# Patient Record
Sex: Male | Born: 1955 | Race: White | Hispanic: No | Marital: Single | Smoking: Never smoker
Health system: Southern US, Community
[De-identification: ages and names within clinical notes are randomized; demographics above are authoritative.]

## PROBLEM LIST (undated history)

## (undated) DIAGNOSIS — K209 Esophagitis, unspecified without bleeding: Secondary | ICD-10-CM

## (undated) DIAGNOSIS — K222 Esophageal obstruction: Secondary | ICD-10-CM

## (undated) DIAGNOSIS — K449 Diaphragmatic hernia without obstruction or gangrene: Secondary | ICD-10-CM

## (undated) DIAGNOSIS — T8859XA Other complications of anesthesia, initial encounter: Secondary | ICD-10-CM

## (undated) DIAGNOSIS — T4145XA Adverse effect of unspecified anesthetic, initial encounter: Secondary | ICD-10-CM

## (undated) DIAGNOSIS — K219 Gastro-esophageal reflux disease without esophagitis: Secondary | ICD-10-CM

## (undated) DIAGNOSIS — R0602 Shortness of breath: Secondary | ICD-10-CM

## (undated) DIAGNOSIS — J45909 Unspecified asthma, uncomplicated: Secondary | ICD-10-CM

## (undated) DIAGNOSIS — G809 Cerebral palsy, unspecified: Secondary | ICD-10-CM

## (undated) DIAGNOSIS — K2270 Barrett's esophagus without dysplasia: Secondary | ICD-10-CM

## (undated) HISTORY — DX: Gastro-esophageal reflux disease without esophagitis: K21.9

## (undated) HISTORY — DX: Esophagitis, unspecified: K20.9

## (undated) HISTORY — DX: Cerebral palsy, unspecified: G80.9

## (undated) HISTORY — DX: Barrett's esophagus without dysplasia: K22.70

## (undated) HISTORY — DX: Diaphragmatic hernia without obstruction or gangrene: K44.9

## (undated) HISTORY — DX: Esophagitis, unspecified without bleeding: K20.90

## (undated) HISTORY — PX: OTHER SURGICAL HISTORY: SHX169

## (undated) HISTORY — DX: Esophageal obstruction: K22.2

---

## 1999-09-08 ENCOUNTER — Ambulatory Visit (HOSPITAL_COMMUNITY): Admission: RE | Admit: 1999-09-08 | Discharge: 1999-09-08 | Payer: Self-pay | Admitting: Internal Medicine

## 1999-09-08 ENCOUNTER — Encounter: Payer: Self-pay | Admitting: Internal Medicine

## 1999-09-30 ENCOUNTER — Encounter: Payer: Self-pay | Admitting: Internal Medicine

## 1999-09-30 ENCOUNTER — Ambulatory Visit (HOSPITAL_COMMUNITY): Admission: RE | Admit: 1999-09-30 | Discharge: 1999-09-30 | Payer: Self-pay | Admitting: Internal Medicine

## 1999-10-20 ENCOUNTER — Ambulatory Visit (HOSPITAL_COMMUNITY): Admission: RE | Admit: 1999-10-20 | Discharge: 1999-10-20 | Payer: Self-pay | Admitting: Internal Medicine

## 1999-10-20 ENCOUNTER — Encounter: Payer: Self-pay | Admitting: Internal Medicine

## 1999-11-10 ENCOUNTER — Ambulatory Visit (HOSPITAL_COMMUNITY): Admission: RE | Admit: 1999-11-10 | Discharge: 1999-11-10 | Payer: Self-pay | Admitting: Internal Medicine

## 1999-11-10 ENCOUNTER — Encounter: Payer: Self-pay | Admitting: Internal Medicine

## 2001-02-03 ENCOUNTER — Ambulatory Visit (HOSPITAL_COMMUNITY): Admission: RE | Admit: 2001-02-03 | Discharge: 2001-02-03 | Payer: Self-pay | Admitting: Internal Medicine

## 2002-08-30 ENCOUNTER — Encounter: Payer: Self-pay | Admitting: Internal Medicine

## 2002-08-30 ENCOUNTER — Ambulatory Visit (HOSPITAL_COMMUNITY): Admission: RE | Admit: 2002-08-30 | Discharge: 2002-08-30 | Payer: Self-pay | Admitting: Internal Medicine

## 2004-04-09 ENCOUNTER — Ambulatory Visit (HOSPITAL_COMMUNITY): Admission: RE | Admit: 2004-04-09 | Discharge: 2004-04-09 | Payer: Self-pay | Admitting: Internal Medicine

## 2007-02-09 ENCOUNTER — Ambulatory Visit: Payer: Self-pay | Admitting: Internal Medicine

## 2007-04-04 ENCOUNTER — Encounter: Admission: RE | Admit: 2007-04-04 | Discharge: 2007-04-04 | Payer: Self-pay | Admitting: Internal Medicine

## 2007-04-04 ENCOUNTER — Ambulatory Visit: Payer: Self-pay | Admitting: Internal Medicine

## 2007-04-11 ENCOUNTER — Ambulatory Visit: Payer: Self-pay | Admitting: Internal Medicine

## 2007-04-11 LAB — CONVERTED CEMR LAB
AST: 16 units/L (ref 0–37)
Cholesterol: 152 mg/dL (ref 0–200)
Creatinine, Ser: 1 mg/dL (ref 0.4–1.5)
Potassium: 3.8 meq/L (ref 3.5–5.1)

## 2007-04-18 ENCOUNTER — Ambulatory Visit: Payer: Self-pay | Admitting: Internal Medicine

## 2007-07-27 ENCOUNTER — Telehealth (INDEPENDENT_AMBULATORY_CARE_PROVIDER_SITE_OTHER): Payer: Self-pay | Admitting: *Deleted

## 2007-07-28 ENCOUNTER — Encounter (INDEPENDENT_AMBULATORY_CARE_PROVIDER_SITE_OTHER): Payer: Self-pay | Admitting: *Deleted

## 2008-01-16 ENCOUNTER — Encounter: Payer: Self-pay | Admitting: Internal Medicine

## 2008-01-17 ENCOUNTER — Encounter: Payer: Self-pay | Admitting: Internal Medicine

## 2008-06-03 DIAGNOSIS — G809 Cerebral palsy, unspecified: Secondary | ICD-10-CM | POA: Insufficient documentation

## 2008-06-03 DIAGNOSIS — K219 Gastro-esophageal reflux disease without esophagitis: Secondary | ICD-10-CM | POA: Insufficient documentation

## 2008-06-03 DIAGNOSIS — K2270 Barrett's esophagus without dysplasia: Secondary | ICD-10-CM | POA: Insufficient documentation

## 2008-06-05 ENCOUNTER — Ambulatory Visit: Payer: Self-pay | Admitting: Internal Medicine

## 2008-06-05 DIAGNOSIS — K222 Esophageal obstruction: Secondary | ICD-10-CM | POA: Insufficient documentation

## 2008-06-06 ENCOUNTER — Encounter: Payer: Self-pay | Admitting: Internal Medicine

## 2008-12-25 ENCOUNTER — Ambulatory Visit: Payer: Self-pay | Admitting: Internal Medicine

## 2008-12-25 ENCOUNTER — Encounter (INDEPENDENT_AMBULATORY_CARE_PROVIDER_SITE_OTHER): Payer: Self-pay | Admitting: *Deleted

## 2008-12-25 DIAGNOSIS — H8110 Benign paroxysmal vertigo, unspecified ear: Secondary | ICD-10-CM | POA: Insufficient documentation

## 2008-12-25 DIAGNOSIS — R0989 Other specified symptoms and signs involving the circulatory and respiratory systems: Secondary | ICD-10-CM | POA: Insufficient documentation

## 2008-12-25 DIAGNOSIS — J309 Allergic rhinitis, unspecified: Secondary | ICD-10-CM | POA: Insufficient documentation

## 2008-12-25 DIAGNOSIS — R0609 Other forms of dyspnea: Secondary | ICD-10-CM

## 2008-12-28 LAB — CONVERTED CEMR LAB
Basophils Absolute: 0 10*3/uL (ref 0.0–0.1)
Bilirubin, Direct: 0.1 mg/dL (ref 0.0–0.3)
Calcium: 9.3 mg/dL (ref 8.4–10.5)
Cholesterol: 155 mg/dL (ref 0–200)
GFR calc Af Amer: 129 mL/min
Glucose, Bld: 112 mg/dL — ABNORMAL HIGH (ref 70–99)
HCT: 45.5 % (ref 39.0–52.0)
Hemoglobin: 15.8 g/dL (ref 13.0–17.0)
MCHC: 34.7 g/dL (ref 30.0–36.0)
MCV: 88.9 fL (ref 78.0–100.0)
Monocytes Absolute: 0.5 10*3/uL (ref 0.1–1.0)
Monocytes Relative: 7.3 % (ref 3.0–12.0)
Neutro Abs: 4.4 10*3/uL (ref 1.4–7.7)
RDW: 12.7 % (ref 11.5–14.6)
Sodium: 144 meq/L (ref 135–145)
TSH: 1.14 microintl units/mL (ref 0.35–5.50)
Total Protein: 6.1 g/dL (ref 6.0–8.3)
Triglycerides: 46 mg/dL (ref 0–149)

## 2008-12-30 ENCOUNTER — Encounter: Admission: RE | Admit: 2008-12-30 | Discharge: 2008-12-30 | Payer: Self-pay | Admitting: Internal Medicine

## 2008-12-30 ENCOUNTER — Encounter (INDEPENDENT_AMBULATORY_CARE_PROVIDER_SITE_OTHER): Payer: Self-pay | Admitting: *Deleted

## 2009-01-02 ENCOUNTER — Telehealth (INDEPENDENT_AMBULATORY_CARE_PROVIDER_SITE_OTHER): Payer: Self-pay | Admitting: *Deleted

## 2009-01-18 ENCOUNTER — Telehealth: Payer: Self-pay | Admitting: Family Medicine

## 2009-01-23 ENCOUNTER — Encounter: Payer: Self-pay | Admitting: Internal Medicine

## 2009-05-21 ENCOUNTER — Ambulatory Visit: Payer: Self-pay | Admitting: Internal Medicine

## 2009-05-21 DIAGNOSIS — N390 Urinary tract infection, site not specified: Secondary | ICD-10-CM | POA: Insufficient documentation

## 2009-05-21 DIAGNOSIS — R9431 Abnormal electrocardiogram [ECG] [EKG]: Secondary | ICD-10-CM | POA: Insufficient documentation

## 2009-05-21 LAB — CONVERTED CEMR LAB
Blood in Urine, dipstick: NEGATIVE
Glucose, Urine, Semiquant: NEGATIVE
Specific Gravity, Urine: 1.015
pH: 7

## 2009-05-22 ENCOUNTER — Encounter: Payer: Self-pay | Admitting: Internal Medicine

## 2009-05-29 ENCOUNTER — Telehealth (INDEPENDENT_AMBULATORY_CARE_PROVIDER_SITE_OTHER): Payer: Self-pay | Admitting: *Deleted

## 2009-05-29 ENCOUNTER — Encounter (INDEPENDENT_AMBULATORY_CARE_PROVIDER_SITE_OTHER): Payer: Self-pay | Admitting: *Deleted

## 2009-06-04 ENCOUNTER — Encounter: Payer: Self-pay | Admitting: Internal Medicine

## 2009-06-26 ENCOUNTER — Encounter: Payer: Self-pay | Admitting: Internal Medicine

## 2009-07-04 ENCOUNTER — Encounter: Payer: Self-pay | Admitting: Internal Medicine

## 2009-07-27 IMAGING — CR DG CHEST 1V
1 series · 1 of 1 positions shown · non-contrast
Comparison: 04/04/2007

CLINICAL DATA: Short of breath

CHEST - 1 VIEW

[view not recorded]
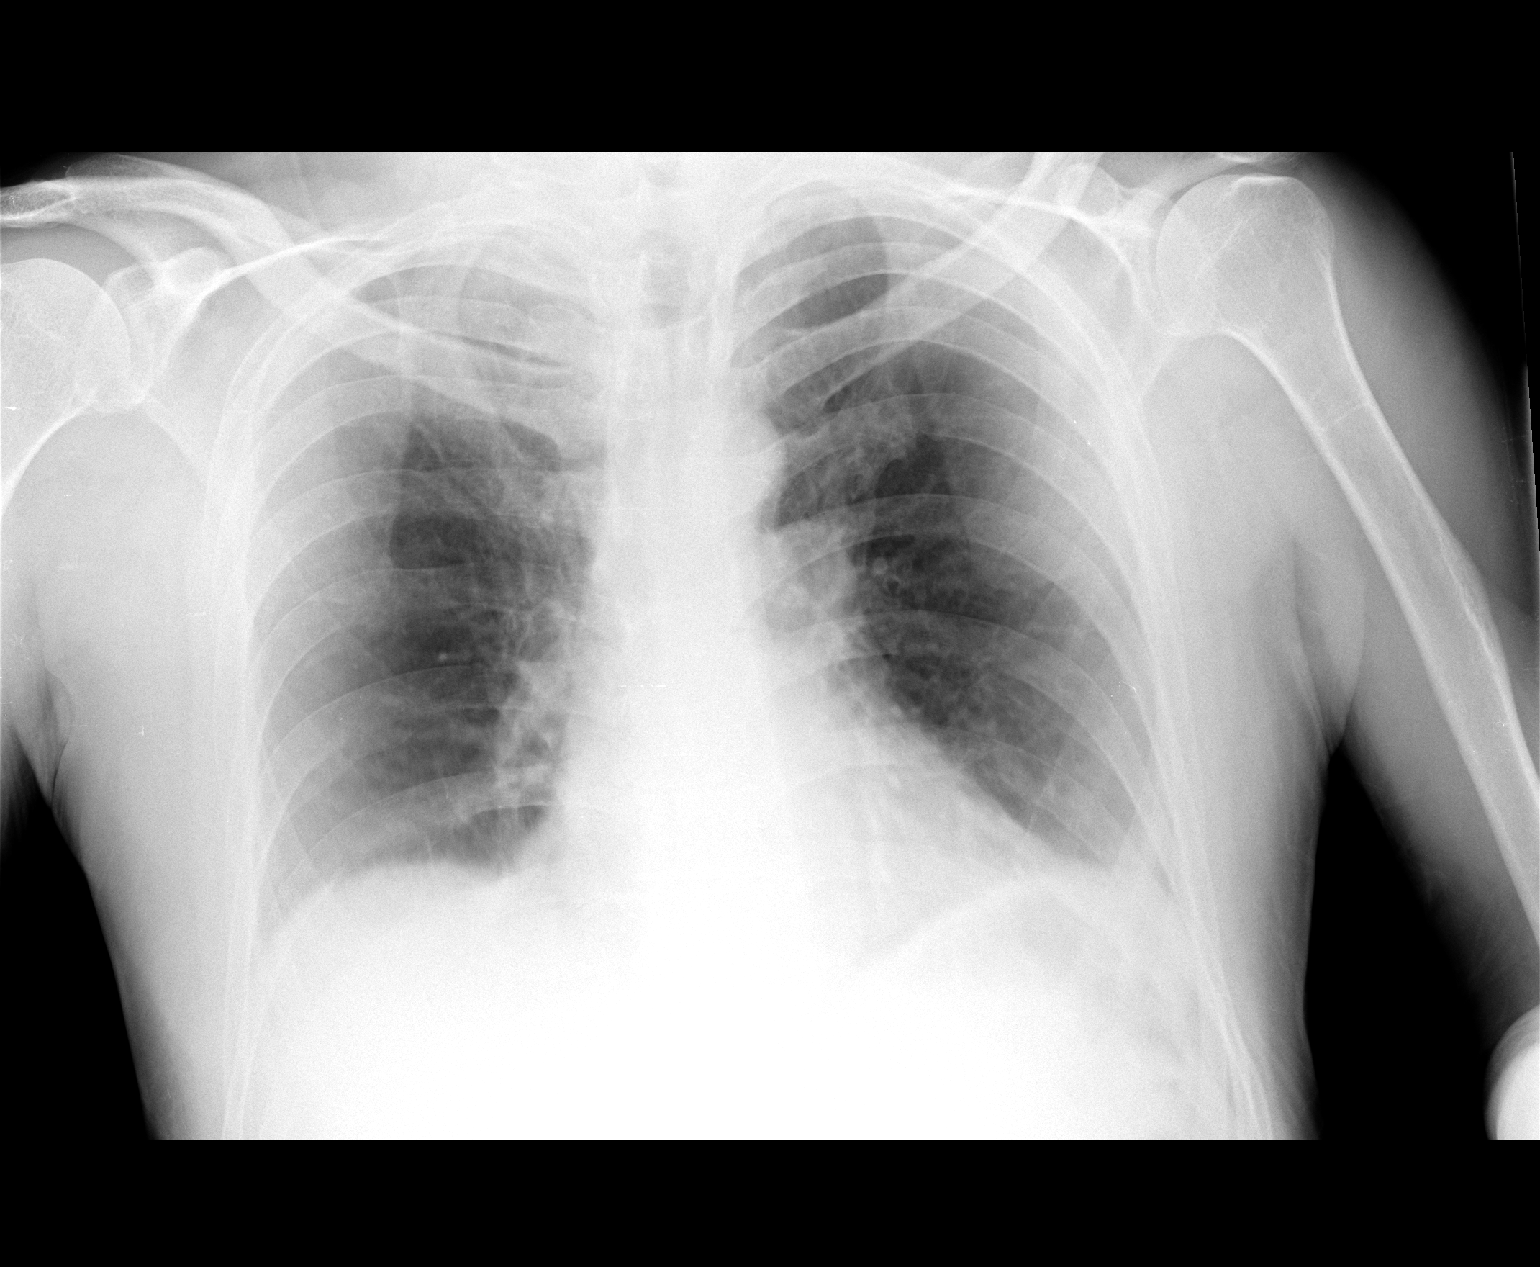

[1 of 1 positions shown; findings below may reference images not displayed]

FINDINGS: An AP upright image was obtained with the patient sitting
in a wheel chair.  He was unable to stand.

The level of inspiration is shallow.  There is some minimal
breathing motion artifact.  Given these limitations, there is no
obvious active cardiopulmonary process noted in one-view.
IMPRESSION: The study is limited technically for reasons mentioned above. No
definite active cardiopulmonary process in one-view.

## 2009-08-27 ENCOUNTER — Encounter: Payer: Self-pay | Admitting: Internal Medicine

## 2010-07-01 ENCOUNTER — Telehealth (INDEPENDENT_AMBULATORY_CARE_PROVIDER_SITE_OTHER): Payer: Self-pay | Admitting: *Deleted

## 2010-09-02 ENCOUNTER — Ambulatory Visit: Payer: Self-pay | Admitting: Internal Medicine

## 2011-01-03 ENCOUNTER — Encounter: Payer: Self-pay | Admitting: Internal Medicine

## 2011-01-14 NOTE — Progress Notes (Signed)
Summary: ear infection  Phone Note Call from Patient Call back at Home Phone [phone]   Caller: Mom Summary of Call: VM left on triage VM pt mom states that pt has a ear infection with some drainage.Pt mom would like something for this pls give mom a call back...............Marland KitchenFelecia Deloach CMA  July 01, 2010 10:32 AM   Follow-up for Phone Call        I spoke with patient's mom and informed her that he needs an appointment, she said she didnt really feel like bringing him in b/c he is in a wheelchair. I informed her its been over a year since patient was last seen and the doctor's are unable to treat patient's over the phone. I offered appointment with Dr.Paz or Dr.Tabori (both had openings at the time of call) (Dr.Hopper is only working 1/2 day). Patient's mom refused appointment's and said let her think about it and call back Follow-up by: Shonna Chock CMA,  July 01, 2010 10:44 AM

## 2011-01-14 NOTE — Assessment & Plan Note (Signed)
Summary: LEFT EAR DRAINING, NO PAIN, NO SINUS///SPH   Vital Signs:  Patient profile:   55 year old male Temp:     97.4 degrees F oral Pulse rate:   72 / minute Resp:     19 per minute BP sitting:   118 / 76  (left arm) Cuff size:   large  Vitals Entered By: Shonna Chock CMA (September 02, 2010 9:01 AM) CC: Left ear drainage X 1.5 Week(s), URI symptoms   Primary Care Provider:  Dr. Marga Melnick  CC:  Left ear drainage X 1.5 Week(s) and URI symptoms.  History of Present Illness:      This is a 55 year old man who presents with  yellow drainage from L ear > 1 week .  The patient reports nasal congestion, but denies purulent nasal discharge, sore throat, productive cough, and earache.  The patient denies fever, dyspnea, and wheezing.  The patient also reports headache.  The patient denies the following risk factors for Strep sinusitis: unilateral facial pain, tooth pain, and tender adenopathy.    Current Medications (verified): 1)  Nexium 40 Mg  Cpdr (Esomeprazole Magnesium) .... Take 1 Tablet By Mouth Once A Day 2)  Adult Aspirin Low Strength 81 Mg Tbdp (Aspirin) .Marland Kitchen.. 1 By Mouth Once Daily 3)  Vitamin C .... Take 1 Tablet By Mouth Once A Day 4)  Vitamin E .... Take 1 Tablet By Mouth Once A Day 5)  Ipratropium-Albuterol 0.5-2.5 (3) Mg/54ml Soln (Ipratropium-Albuterol) .Marland Kitchen.. 1 Ampule Q 4-6 Hrs As Needed Sob 6)  Metoprolol Succinate 25 Mg Xr24h-Tab (Metoprolol Succinate) .Marland Kitchen.. 1 By Mouth Once Daily  Allergies (verified): No Known Drug Allergies  Physical Exam  General:  Chronic neuromuscular stigmata,in no acute distress; cooperative throughout examination; dysarthria hinders communication Ears:  R ear normal, L canal drainage, L TM erythema, and L TM retraction.   Nose:  External nasal examination shows no deformity or inflammation. Nasal mucosa are pink and moist without lesions or exudates. Mouth:  Oral mucosa and oropharynx without lesions or exudates.  Teeth in good  repair. Lungs:  Normal respiratory effort, chest expands symmetrically. Lungs are clear to auscultation, no crackles or wheezes.BS decreased Cervical Nodes:  No lymphadenopathy noted Axillary Nodes:  No palpable lymphadenopathy   Impression & Recommendations:  Problem # 1:  OTITIS MEDIA, PURULENT, ACUTE (ICD-382.00)  His updated medication list for this problem includes:    Adult Aspirin Low Strength 81 Mg Tbdp (Aspirin) .Marland Kitchen... 1 by mouth once daily    Amoxicillin 500 Mg Caps (Amoxicillin) .Marland Kitchen... 1 three times a day  Complete Medication List: 1)  Nexium 40 Mg Cpdr (Esomeprazole magnesium) .... Take 1 tablet by mouth once a day 2)  Adult Aspirin Low Strength 81 Mg Tbdp (Aspirin) .Marland Kitchen.. 1 by mouth once daily 3)  Vitamin C  .... Take 1 tablet by mouth once a day 4)  Vitamin E  .... Take 1 tablet by mouth once a day 5)  Ipratropium-albuterol 0.5-2.5 (3) Mg/28ml Soln (Ipratropium-albuterol) .Marland Kitchen.. 1 ampule q 4-6 hrs as needed sob 6)  Metoprolol Succinate 25 Mg Xr24h-tab (Metoprolol succinate) .Marland Kitchen.. 1 by mouth once daily 7)  Amoxicillin 500 Mg Caps (Amoxicillin) .Marland Kitchen.. 1 three times a day 8)  Coly-mycin S 3.02-12-09-0.5 Mg/ml Susp (Neomycin-colist-hc-thonzonium) .... 3 drops three times a day  Patient Instructions: 1)  Drink as much NON dairy  fluid as you can tolerate for the next few days. Prescriptions: COLY-MYCIN S 3.02-12-09-0.5 MG/ML SUSP (NEOMYCIN-COLIST-HC-THONZONIUM) 3 drops three times  a day  #10cc x 0   Entered and Authorized by:   Marga Melnick MD   Signed by:   Marga Melnick MD on 09/02/2010   Method used:   Faxed to ...       [id number] (retail)       [address]       [place], [place]  [address]       Ph: [phone] or [phone]       Fax: [phone]   RxID:   [id number] AMOXICILLIN 500 MG CAPS (AMOXICILLIN) 1 three times a day  #30 x 0   Entered and Authorized by:   Marga Melnick MD   Signed by:   Marga Melnick MD on 09/02/2010   Method used:   Faxed to ...        [id number] (retail)       [address]       [place], [place]  [address]       Ph: [phone] or [phone]       Fax: [phone]   RxID:   [id number]

## 2011-06-26 ENCOUNTER — Other Ambulatory Visit: Payer: Self-pay | Admitting: Internal Medicine

## 2011-06-28 MED ORDER — ESOMEPRAZOLE MAGNESIUM 40 MG PO CPDR: 40.0000 mg | DELAYED_RELEASE_CAPSULE | Freq: Every day | ORAL | Status: DC

## 2011-06-28 NOTE — Telephone Encounter (Signed)
#  [facility]

## 2012-09-17 ENCOUNTER — Other Ambulatory Visit: Payer: Self-pay | Admitting: Internal Medicine

## 2012-10-27 ENCOUNTER — Other Ambulatory Visit: Payer: Self-pay | Admitting: Internal Medicine

## 2012-10-27 ENCOUNTER — Encounter: Payer: Self-pay | Admitting: Internal Medicine

## 2012-10-27 MED ORDER — ESOMEPRAZOLE MAGNESIUM 40 MG PO CPDR: 40.0000 mg | DELAYED_RELEASE_CAPSULE | Freq: Every day | ORAL | Status: DC

## 2012-10-27 NOTE — Telephone Encounter (Signed)
Refilled Nexium 

## 2012-11-29 ENCOUNTER — Ambulatory Visit: Payer: Self-pay | Admitting: Internal Medicine

## 2012-12-01 ENCOUNTER — Encounter: Payer: Self-pay | Admitting: *Deleted

## 2012-12-04 ENCOUNTER — Ambulatory Visit (INDEPENDENT_AMBULATORY_CARE_PROVIDER_SITE_OTHER): Payer: Medicare Other | Admitting: Internal Medicine

## 2012-12-04 ENCOUNTER — Encounter: Payer: Self-pay | Admitting: Internal Medicine

## 2012-12-04 VITALS — BP 154/100 | HR 88

## 2012-12-04 DIAGNOSIS — K2270 Barrett's esophagus without dysplasia: Secondary | ICD-10-CM

## 2012-12-04 DIAGNOSIS — R1310 Dysphagia, unspecified: Secondary | ICD-10-CM

## 2012-12-04 DIAGNOSIS — K222 Esophageal obstruction: Secondary | ICD-10-CM

## 2012-12-04 DIAGNOSIS — K219 Gastro-esophageal reflux disease without esophagitis: Secondary | ICD-10-CM

## 2012-12-04 MED ORDER — ESOMEPRAZOLE MAGNESIUM 40 MG PO CPDR: 40.0000 mg | DELAYED_RELEASE_CAPSULE | Freq: Every day | ORAL | Status: DC

## 2012-12-04 NOTE — Progress Notes (Signed)
HISTORY OF PRESENT ILLNESS:  Juan Dennis is a 56 y.o. male with cerebral palsy who presents today regarding reflux disease. He is accompanied by his mother. He was last seen in June of 2009. He is known to have reflux disease complicated by Barrett esophagus and peptic stricture requiring esophageal dilation. He has been on Nexium 40 mg daily with good control of symptoms. Significant symptoms off medication. He ran out of medication recently with worsening of symptoms. Now on over-the-counter Prevacid. It appears as last upper endoscopy was performed in June of 2009. He was post followup in 1 year. He has not been seen since. His mother reports coughing spells with eating and drinking. She feels that he eats to fast and does not chew his food well. It is not clear whether he is having esophageal dysphagia. Patient cannot contribute significantly to the history. GI review of systems is otherwise reported as negative.  REVIEW OF SYSTEMS:  All non-GI ROS negative except for cough and occasional exertional shortness of breath. Decreased hearing in the left ear  Past Medical History  Diagnosis Date  . Barrett's esophagus   . Esophageal stricture   . GERD (gastroesophageal reflux disease)   . Cerebral palsy   . Esophagitis   . Hiatal hernia     Past Surgical History  Procedure Date  . Adductor myotomy     both legs    Social History Latrel H Moody  reports that he has never smoked. He has never used smokeless tobacco. He reports that he does not drink alcohol or use illicit drugs.  family history includes Colon cancer in his maternal grandmother; Diabetes in his father; Heart disease in his father and mother; and Uterine cancer in his maternal grandmother.  No Known Allergies     PHYSICAL EXAMINATION: Vital signs: BP 154/100  Pulse 88 General: Well-developed, well-nourished, no acute distress. Wheelchair-bound. HEENT: Sclerae are anicteric, conjunctiva pink. Oral mucosa intact Lungs:  Clear Heart: Regular Abdomen: soft, nontender, nondistended, no obvious ascites, no peritoneal signs, normal bowel sounds. No organomegaly. Extremities: contractures,No edema Psychiatric: alert and oriented x3. Cooperative    ASSESSMENT:  #1. GERD, Barrett's esophagus and peptic stricture. Overdue for surveillance #2. Coughing or choking spells as described   PLAN:  #1. Reflux precautions #2. Refill Nexium 40 mg daily. Samples given #3. Schedule upper endoscopy with surveillance biopsies and possible esophageal dilation. At the hospital. The patient is high-risk. CRNA monitor propofol administration.The nature of the procedure, as well as the risks, benefits, and alternatives were carefully and thoroughly reviewed with the patient. Ample time for discussion and questions allowed. The patient understood, was satisfied, and agreed to proceed.  #4. May benefit from modified barium swallow with speech pathology as the patient may be experiencing some degree of penetration or aspiration with meals. Discussed with mother. She somewhat hesitant.

## 2012-12-04 NOTE — Patient Instructions (Addendum)
You have been scheduled for an endoscopy with propofol. Please follow written instructions given to you at your visit today. If you use inhalers (even only as needed) or a CPAP machine, please bring them with you on the day of your procedure.  You have been given samples of Nexium to continue to take one tablet by mouth once daily. A prescription has been sent to your pharmacy.

## 2013-01-05 ENCOUNTER — Encounter (HOSPITAL_COMMUNITY): Payer: Self-pay | Admitting: Pharmacy Technician

## 2013-01-11 ENCOUNTER — Encounter (HOSPITAL_COMMUNITY): Payer: Self-pay | Admitting: *Deleted

## 2013-01-11 NOTE — Pre-Procedure Instructions (Signed)
Your procedure is scheduled JY:[id number] 11, 2014 Report to [employer] [phone] Call this number if you have problems morning of your procedure:[phone]  Follow all bowel prep instructions per your doctor's orders.  Do not eat or drink anything after midnight the night before your procedure. You may brush your teeth, rinse out your mouth, but no water, no food, no chewing gum, no mints, no candies, no chewing tobacco.     Take these medicines the morning of your procedure with A SIP OF WATER:Metoprolol and Nexium   Please make arrangements for a responsible person to drive you home after the procedure. You cannot go home by cab/taxi. We recommend you have someone with you at home the first 24 hours after your procedure. Driver for procedure is mother [place] [phone]  LEAVE ALL VALUABLES, JEWELRY, BILLFOLD AT HOME.  NO DENTURES, CONTACT LENSES ALLOWED IN THE ENDOSCOPY ROOM.   YOU MAY WEAR DEODORANT, PLEASE REMOVE ALL JEWELRY, WATCHES RINGS, BODY PIERCINGS AND LEAVE AT HOME.   WOMEN: NO MAKE-UP, LOTIONS PERFUMES

## 2013-01-12 ENCOUNTER — Encounter (HOSPITAL_COMMUNITY): Payer: Self-pay | Admitting: *Deleted

## 2013-01-17 ENCOUNTER — Telehealth: Payer: Self-pay | Admitting: *Deleted

## 2013-01-17 ENCOUNTER — Telehealth: Payer: Self-pay | Admitting: Internal Medicine

## 2013-01-17 NOTE — Telephone Encounter (Signed)
VM left stating that Pt is schedule to have a endoscopy on 01-23-13. Pt just wanted Hopp to be aware.

## 2013-01-17 NOTE — Telephone Encounter (Signed)
Pts mother is concerned about the pt having the egd with dil. In the past pt has received fentanyl and versed and done fine. She is concerned because pt has not been put to sleep since he was 57 years old. She is worried because if he gets nauseated he cannot turn over on his own and he can't tell them. Pt only signs. I let her know I would convey her concerns to Dr. Marina Goodell. Dr. Marina Goodell notified.

## 2013-01-18 NOTE — Telephone Encounter (Signed)
Spoke with mother and she is aware

## 2013-01-18 NOTE — Telephone Encounter (Signed)
Let her know that propofol is a more modern and safer form of sedation. This was not practically available to him previously. The sedation is given by anesthesia expert to monitor him very closely. He has not put to sleep to a level where he needs a breathing tube. Also, recovery time is much quicker.

## 2013-01-23 ENCOUNTER — Other Ambulatory Visit: Payer: Self-pay | Admitting: Internal Medicine

## 2013-01-23 ENCOUNTER — Ambulatory Visit (HOSPITAL_COMMUNITY): Payer: Medicare Other | Admitting: Anesthesiology

## 2013-01-23 ENCOUNTER — Encounter (HOSPITAL_COMMUNITY): Payer: Self-pay | Admitting: *Deleted

## 2013-01-23 ENCOUNTER — Ambulatory Visit (HOSPITAL_COMMUNITY): Payer: Medicare Other

## 2013-01-23 ENCOUNTER — Encounter (HOSPITAL_COMMUNITY)
Admission: RE | Disposition: A | Discharge status: Home / Self Care | Payer: Self-pay | Source: Ambulatory Visit | Attending: Internal Medicine

## 2013-01-23 ENCOUNTER — Ambulatory Visit (HOSPITAL_COMMUNITY)
Admission: RE | Admit: 2013-01-23 | Discharge: 2013-01-23 | Disposition: A | Discharge status: Home / Self Care | Payer: Medicare Other | Source: Ambulatory Visit | Attending: Internal Medicine | Admitting: Internal Medicine

## 2013-01-23 ENCOUNTER — Encounter (HOSPITAL_COMMUNITY): Payer: Self-pay | Admitting: Anesthesiology

## 2013-01-23 DIAGNOSIS — G809 Cerebral palsy, unspecified: Secondary | ICD-10-CM | POA: Insufficient documentation

## 2013-01-23 DIAGNOSIS — K319 Disease of stomach and duodenum, unspecified: Secondary | ICD-10-CM | POA: Insufficient documentation

## 2013-01-23 DIAGNOSIS — Z79899 Other long term (current) drug therapy: Secondary | ICD-10-CM | POA: Insufficient documentation

## 2013-01-23 DIAGNOSIS — K222 Esophageal obstruction: Secondary | ICD-10-CM

## 2013-01-23 DIAGNOSIS — R1319 Other dysphagia: Secondary | ICD-10-CM

## 2013-01-23 DIAGNOSIS — K219 Gastro-esophageal reflux disease without esophagitis: Secondary | ICD-10-CM | POA: Insufficient documentation

## 2013-01-23 DIAGNOSIS — R05 Cough: Secondary | ICD-10-CM | POA: Insufficient documentation

## 2013-01-23 DIAGNOSIS — K2270 Barrett's esophagus without dysplasia: Secondary | ICD-10-CM | POA: Insufficient documentation

## 2013-01-23 DIAGNOSIS — R1310 Dysphagia, unspecified: Secondary | ICD-10-CM | POA: Insufficient documentation

## 2013-01-23 DIAGNOSIS — R059 Cough, unspecified: Secondary | ICD-10-CM | POA: Insufficient documentation

## 2013-01-23 HISTORY — PX: SAVORY DILATION: SHX5439

## 2013-01-23 HISTORY — DX: Shortness of breath: R06.02

## 2013-01-23 HISTORY — DX: Other complications of anesthesia, initial encounter: T88.59XA

## 2013-01-23 HISTORY — PX: ESOPHAGOGASTRODUODENOSCOPY: SHX5428

## 2013-01-23 HISTORY — DX: Adverse effect of unspecified anesthetic, initial encounter: T41.45XA

## 2013-01-23 HISTORY — DX: Unspecified asthma, uncomplicated: J45.909

## 2013-01-23 SURGERY — EGD (ESOPHAGOGASTRODUODENOSCOPY)
Anesthesia: Monitor Anesthesia Care

## 2013-01-23 MED ORDER — LACTATED RINGERS IV SOLN: INTRAVENOUS | Status: DC

## 2013-01-23 MED ORDER — FENTANYL CITRATE 0.05 MG/ML IJ SOLN
INTRAMUSCULAR | Status: DC | PRN
  Administered 2013-01-23 (×2): 50 ug via INTRAVENOUS

## 2013-01-23 MED ORDER — SODIUM CHLORIDE 0.9 % IV SOLN: INTRAVENOUS | Status: DC

## 2013-01-23 MED ORDER — PROPOFOL 10 MG/ML IV EMUL
INTRAVENOUS | Status: DC | PRN
  Administered 2013-01-23: 120 ug/kg/min via INTRAVENOUS

## 2013-01-23 MED ORDER — LACTATED RINGERS IV SOLN
INTRAVENOUS | Status: DC | PRN
  Administered 2013-01-23: 11:00:00 via INTRAVENOUS

## 2013-01-23 MED ORDER — KETAMINE HCL 10 MG/ML IJ SOLN
INTRAMUSCULAR | Status: DC | PRN
  Administered 2013-01-23 (×2): 10 mg via INTRAVENOUS

## 2013-01-23 MED ORDER — MIDAZOLAM HCL 5 MG/5ML IJ SOLN
INTRAMUSCULAR | Status: DC | PRN
  Administered 2013-01-23: 1 mg via INTRAVENOUS

## 2013-01-23 NOTE — Transfer of Care (Signed)
Immediate Anesthesia Transfer of Care Note  Patient: Juan Dennis  Procedure(s) Performed: Procedure(s): ESOPHAGOGASTRODUODENOSCOPY (EGD) (N/A) SAVORY DILATION (N/A)  Patient Location: PACU and Endoscopy Unit  Anesthesia Type:MAC  Level of Consciousness: awake and sedated  Airway & Oxygen Therapy: Patient Spontanous Breathing and Patient connected to nasal cannula oxygen  Post-op Assessment: Report given to PACU RN and Post -op Vital signs reviewed and stable  Post vital signs: Reviewed and stable  Complications: No apparent anesthesia complications

## 2013-01-23 NOTE — Op Note (Signed)
[facility] [address] [place], [address]   ENDOSCOPY PROCEDURE REPORT  PATIENT: Juan Dennis, Juan Dennis  MR#: [id number] BIRTHDATE: [birth year] , 57  yrs. old GENDER: Male ENDOSCOPIST: Roxy Cedar, MD REFERRED BY:  .  Self / Office PROCEDURE DATE:  01/23/2013 PROCEDURE:  EGD w/ biopsies     and EGD Savary dilation of esophagus (18mm) ASA CLASS:     Class II INDICATIONS:  history of Barrett's esophagus.   Dysphagia. MEDICATIONS: MAC sedation, administered by CRNA and See Anesthesia Report. TOPICAL ANESTHETIC: none  DESCRIPTION OF PROCEDURE: After the risks benefits and alternatives of the procedure were thoroughly explained, informed consent was obtained.  The Pentax Gastroscope [id number] endoscope was introduced through the mouth and advanced to the second portion of the duodenum. Without limitations.  The instrument was slowly withdrawn as the mucosa was fully examined.      The esophagus revealed Barrett's type columnar Mucosa in the distal 3-4 cm.  four-quadrant biopsies were taken every 2 cm.No active inflammation or nodularity.  No obvious stricture.the stomach revealed a small hiatal hernia and a few small benign gastric polyps.  No other abnormalities.  The duodenum was normal.  THERAPY: A Savary guidewire was placed into the gastric antrum under fluoroscopic control.  The endoscope was removed with the guidewire maintained in position.  Subsequently, an 18 mm Savary dilator was moderate over the guidewire.  This was followed on fluoroscopy.  No resistance or heme.  Tolerated well.         The scope was then withdrawn from the patient and the procedure completed.  COMPLICATIONS: There were no complications. ENDOSCOPIC IMPRESSION: 1.  Barrett's esophagus status post biopsies 2. History of esophageal stricture. Questionable dysphagia. Status post dilation 3. GERD  RECOMMENDATIONS: 1.  Clear liquids until , then soft foods rest iof day.  Resume prior  diet tomorrow. 2.  REPEAT SURVEILLANCE EGD IN 3 YEARS if no dysplasia on biopsy 3. Continue current medications  REPEAT EXAM:    eSigned:  Roxy Cedar, MD 01/23/2013 12:13 PM CC:The Patient

## 2013-01-23 NOTE — Anesthesia Postprocedure Evaluation (Signed)
Anesthesia Post Note  Patient: Juan Dennis  Procedure(s) Performed: Procedure(s) (LRB): ESOPHAGOGASTRODUODENOSCOPY (EGD) (N/A) SAVORY DILATION (N/A)  Anesthesia type: MAC  Patient location: PACU  Post pain: Pain level controlled  Post assessment: Post-op Vital signs reviewed  Last Vitals: BP 123/73  Temp(Src) 36.7 C (Oral)  Resp 12  Ht 5\' 7"  (1.702 m)  Wt 130 lb (58.968 kg)  BMI 20.36 kg/m2  SpO2 97%  Post vital signs: Reviewed  Level of consciousness: awake  Complications: No apparent anesthesia complications

## 2013-01-23 NOTE — Anesthesia Preprocedure Evaluation (Addendum)
Anesthesia Evaluation  Patient identified by MRN, date of birth, ID band Patient awake    Reviewed: Allergy & Precautions, H&P , NPO status , Patient's Chart, lab work & pertinent test results  History of Anesthesia Complications Negative for: history of anesthetic complications  Airway Mallampati: I TM Distance: >3 FB Neck ROM: Full    Dental  (+) Teeth Intact and Dental Advisory Given   Pulmonary shortness of breath and with exertion, asthma ,  breath sounds clear to auscultation  Pulmonary exam normal       Cardiovascular negative cardio ROS  Rhythm:Regular Rate:Normal     Neuro/Psych Cerebral palsy negative psych ROS   GI/Hepatic Neg liver ROS, hiatal hernia, GERD-  Medicated,  Endo/Other  negative endocrine ROS  Renal/GU negative Renal ROS     Musculoskeletal negative musculoskeletal ROS (+)   Abdominal   Peds  Hematology negative hematology ROS (+)   Anesthesia Other Findings   Reproductive/Obstetrics                          Anesthesia Physical Anesthesia Plan  ASA: III  Anesthesia Plan: MAC   Post-op Pain Management:    Induction: Intravenous  Airway Management Planned: Simple Face Mask  Additional Equipment:   Intra-op Plan:   Post-operative Plan:   Informed Consent: I have reviewed the patients History and Physical, chart, labs and discussed the procedure including the risks, benefits and alternatives for the proposed anesthesia with the patient or authorized representative who has indicated his/her understanding and acceptance.   Consent reviewed with POA  Plan Discussed with: CRNA  Anesthesia Plan Comments:         Anesthesia Quick Evaluation

## 2013-01-23 NOTE — H&P (Signed)
HISTORY OF PRESENT ILLNESS:  Juan Dennis is a 57 y.o. male with cerebral palsy who presents today regarding reflux disease. He is accompanied by his mother. He was last seen in June of 2009. He is known to have reflux disease complicated by Barrett esophagus and peptic stricture requiring esophageal dilation. He has been on Nexium 40 mg daily with good control of symptoms. Significant symptoms off medication. He ran out of medication recently with worsening of symptoms. Now on over-the-counter Prevacid. It appears as last upper endoscopy was performed in June of 2009. He was post followup in 1 year. He has not been seen since. His mother reports coughing spells with eating and drinking. She feels that he eats to fast and does not chew his food well. It is not clear whether he is having esophageal dysphagia. Patient cannot contribute significantly to the history. GI review of systems is otherwise reported as negative.  REVIEW OF SYSTEMS:  All non-GI ROS negative except for cough and occasional exertional shortness of breath. Decreased hearing in the left ear  Past Medical History   Diagnosis  Date   .  Barrett's esophagus    .  Esophageal stricture    .  GERD (gastroesophageal reflux disease)    .  Cerebral palsy    .  Esophagitis    .  Hiatal hernia     Past Surgical History   Procedure  Date   .  Adductor myotomy      both legs   Social History  Juan Dennis reports that he has never smoked. He has never used smokeless tobacco. He reports that he does not drink alcohol or use illicit drugs.  family history includes Colon cancer in his maternal grandmother; Diabetes in his father; Heart disease in his father and mother; and Uterine cancer in his maternal grandmother.  No Known Allergies  PHYSICAL EXAMINATION:  Vital signs: BP 154/100  Pulse 88  General: Well-developed, well-nourished, no acute distress. Wheelchair-bound.  HEENT: Sclerae are anicteric, conjunctiva pink. Oral mucosa intact   Lungs: Clear  Heart: Regular  Abdomen: soft, nontender, nondistended, no obvious ascites, no peritoneal signs, normal bowel sounds. No organomegaly.  Extremities: contractures,No edema  Psychiatric: alert and oriented x3. Cooperative  ASSESSMENT:  #1. GERD, Barrett's esophagus and peptic stricture. Overdue for surveillance  #2. Coughing or choking spells as described  PLAN:  #1. Reflux precautions  #2. Refill Nexium 40 mg daily. Samples given  #3. Schedule upper endoscopy with surveillance biopsies and possible esophageal dilation. At the hospital. The patient is high-risk. CRNA monitor propofol administration.The nature of the procedure, as well as the risks, benefits, and alternatives were carefully and thoroughly reviewed with the patient. Ample time for discussion and questions allowed. The patient understood, was satisfied, and agreed to proceed.  #4. May benefit from modified barium swallow with speech pathology as the patient may be experiencing some degree of penetration or aspiration with meals. Discussed with mother. She somewhat hesitant.   GI ATTENDING  Patient was seen in late December regarding GERD, Barrett's surveillance, and known peptic stricture with possible dysphagia. No interval change in the GI or general medical history. He is now for upper endoscopy the biopsies and possibly esophageal dilation. Physical exam is also unchanged.The nature of the procedure, as well as the risks, benefits, and alternatives were carefully and thoroughly reviewed with the patient. Ample time for discussion and questions allowed. The patient understood, was satisfied, and agreed to proceed.  Wilhemina Bonito. Marina Goodell,  Montez Hageman., M.D. [facility] Division of Gastroenterology

## 2013-01-24 ENCOUNTER — Encounter (HOSPITAL_COMMUNITY): Payer: Self-pay | Admitting: Internal Medicine

## 2013-01-24 ENCOUNTER — Encounter: Payer: Self-pay | Admitting: Internal Medicine

## 2014-01-09 ENCOUNTER — Telehealth: Payer: Self-pay

## 2014-01-09 MED ORDER — ESOMEPRAZOLE MAGNESIUM 40 MG PO CPDR: 40.0000 mg | DELAYED_RELEASE_CAPSULE | Freq: Every day | ORAL | Status: DC

## 2014-01-09 NOTE — Telephone Encounter (Signed)
Sent refilled rx for Nexium to new pharmacy per patient's request

## 2014-08-15 ENCOUNTER — Other Ambulatory Visit: Payer: Self-pay | Admitting: Internal Medicine

## 2014-11-16 ENCOUNTER — Other Ambulatory Visit: Payer: Self-pay | Admitting: Internal Medicine

## 2014-12-16 ENCOUNTER — Other Ambulatory Visit: Payer: Self-pay | Admitting: Internal Medicine

## 2015-01-17 ENCOUNTER — Telehealth: Payer: Self-pay

## 2015-01-17 NOTE — Telephone Encounter (Signed)
Spoke with [facility] at [employer] [employer] [employer] who is faxing the prior authorization form on patien'ts Nexium.  I will complete and fax back as soon as I get it.

## 2015-01-23 NOTE — Telephone Encounter (Signed)
Faxed prior authorization on Nexium.  Awaiting reponse.

## 2015-01-23 NOTE — Telephone Encounter (Signed)
Have called 2 separate times to get form faxed to me.  Verified fax number with rep.  They are going to try sending it to the fax machine in [place]

## 2015-01-28 NOTE — Telephone Encounter (Signed)
Spoke with Patient's mother who said patient's Nexium had been denied.  The insurance company is faxing me an appeal for fill out.  I told her I would fill it out and fax it back as soon as possible and then let her know.  Patient's mother acknowledged and understood.

## 2015-01-30 ENCOUNTER — Telehealth: Payer: Self-pay | Admitting: Internal Medicine

## 2015-01-30 NOTE — Telephone Encounter (Signed)
Pts mother calling wanting to know if we have heard anything from insurance co regarding Nexium.

## 2015-02-04 ENCOUNTER — Telehealth: Payer: Self-pay

## 2015-02-04 ENCOUNTER — Other Ambulatory Visit: Payer: Self-pay | Admitting: Internal Medicine

## 2015-02-04 MED ORDER — PANTOPRAZOLE SODIUM 40 MG PO TBEC: 40.0000 mg | DELAYED_RELEASE_TABLET | Freq: Every day | ORAL | Status: DC

## 2015-02-04 NOTE — Telephone Encounter (Signed)
Sent Pantoprazole to pharmacy [facility] [employer] - patient needs to try and fail this and one other before Nexium denial can be appealed.

## 2015-02-04 NOTE — Telephone Encounter (Signed)
Pantoprazole sent to pharmacy for patient to try, which is on his formulary.  If this doesn't work, we will be in a better position to appeal denial of Nexium. Called patient's mother a left a message to this effect.

## 2015-02-04 NOTE — Telephone Encounter (Signed)
Left message with patient's mother regarding the Nexium denial and the necessity to try Pantoprazole instead.  Sent Pantoprazole to pharmacy.  If this doesn't work we will try to appeal the Nexium again.

## 2015-02-05 ENCOUNTER — Telehealth: Payer: Self-pay

## 2015-02-05 NOTE — Telephone Encounter (Signed)
Spoke with patient's mother and clarified the message I had left - that we were going to try Pantoprazole per the insurance company in place of Nexium.  She was very distressed because patient has barretts and had problems with Omeprazole when that was tried.  I assured her that if Pantoprazole did not work we would immediately appeal for the Nexium, but would be in a better position for approval having tried the Pantoprazole first.  Patient's mother agreed.

## 2015-02-05 NOTE — Telephone Encounter (Signed)
Lm on cell.

## 2015-05-03 ENCOUNTER — Other Ambulatory Visit: Payer: Self-pay | Admitting: Internal Medicine

## 2015-06-01 ENCOUNTER — Other Ambulatory Visit: Payer: Self-pay | Admitting: Internal Medicine

## 2015-06-09 ENCOUNTER — Telehealth: Payer: Self-pay | Admitting: Internal Medicine

## 2015-06-11 MED ORDER — PANTOPRAZOLE SODIUM 40 MG PO TBEC: 40.0000 mg | DELAYED_RELEASE_TABLET | Freq: Every day | ORAL | Status: DC

## 2015-06-11 NOTE — Telephone Encounter (Signed)
Refilled Pantoprazole 

## 2015-11-26 ENCOUNTER — Ambulatory Visit: Payer: Self-pay | Admitting: Internal Medicine

## 2015-12-19 ENCOUNTER — Other Ambulatory Visit: Payer: Self-pay | Admitting: Internal Medicine

## 2015-12-19 ENCOUNTER — Ambulatory Visit: Payer: Self-pay | Admitting: Internal Medicine

## 2015-12-22 ENCOUNTER — Ambulatory Visit: Payer: Self-pay | Admitting: Internal Medicine

## 2015-12-30 ENCOUNTER — Ambulatory Visit: Payer: Self-pay | Admitting: Internal Medicine

## 2016-01-21 ENCOUNTER — Encounter: Payer: Self-pay | Admitting: Internal Medicine

## 2016-01-21 ENCOUNTER — Ambulatory Visit (INDEPENDENT_AMBULATORY_CARE_PROVIDER_SITE_OTHER): Payer: Medicare Other | Admitting: Internal Medicine

## 2016-01-21 VITALS — BP 148/70 | HR 88

## 2016-01-21 DIAGNOSIS — K2270 Barrett's esophagus without dysplasia: Secondary | ICD-10-CM

## 2016-01-21 DIAGNOSIS — K219 Gastro-esophageal reflux disease without esophagitis: Secondary | ICD-10-CM | POA: Diagnosis not present

## 2016-01-21 NOTE — Patient Instructions (Signed)
Please follow up as needed 

## 2016-01-21 NOTE — Progress Notes (Signed)
HISTORY OF PRESENT ILLNESS:  Juan Dennis is a 60 y.o. male with cerebral palsy who presents today with his mother and sister regarding ongoing management of his chronic reflux disease. He has a history of Barrett's esophagus and peptic stricture which has required esophageal dilation. He was last evaluated in this office December 2013. At that time he was taking Nexium 40 mg daily. Mother reported coughing or choking spells as described. Subsequently underwent upper endoscopy and was found to have 3-4 cm of nondysplastic Barrett's esophagus. No obvious stricture though empiric esophageal dilation to 18 mm carried out. He was continued on PPI thereafter. Has not been seen since. His mother reports that the dilation did not effect his swallowing or episodes of coughing/choking. He continues with the same. He did have his Nexium changed to pantoprazole due to insurance formulary preference. She does feel that his reflux symptoms are not quite as well controlled, though not severe. Mostly breakthrough at night. GI review of systems is otherwise negative. We did discuss today surveillance endoscopy. They are not interested in pursuing this currently.  REVIEW OF SYSTEMS:  All non-GI ROS negative except for cough  Past Medical History  Diagnosis Date  . Barrett's esophagus   . Esophageal stricture   . GERD (gastroesophageal reflux disease)   . Cerebral palsy (HCC)   . Esophagitis   . Hiatal hernia   . Asthma     when younger  . Shortness of breath   . Complication of anesthesia     Past Surgical History  Procedure Laterality Date  . Adductor myotomy      both legs  . Esophagogastroduodenoscopy N/A 01/23/2013    Procedure: ESOPHAGOGASTRODUODENOSCOPY (EGD);  Surgeon: Hilarie Fredrickson, MD;  Location: [facility];  Service: Endoscopy;  Laterality: N/A;  . Savory dilation N/A 01/23/2013    Procedure: SAVORY DILATION;  Surgeon: Hilarie Fredrickson, MD;  Location: [facility];  Service: Endoscopy;  Laterality:  N/A;    Social History Cuinn H Leaks  reports that he has never smoked. He has never used smokeless tobacco. He reports that he does not drink alcohol or use illicit drugs.  family history includes Colon cancer in his maternal grandmother; Diabetes in his father; Heart disease in his father and mother; Uterine cancer in his maternal grandmother.  No Known Allergies     PHYSICAL EXAMINATION:  Vital signs: BP 148/70 mmHg  Pulse 88  Ht   Wt  General: Well-developed, well-nourished, no acute distress. Obvious cervical palsy with wheelchair-bound and extremity contractures HEENT: Sclerae are anicteric, conjunctiva pink. Oral mucosa intact Lungs: Clear Heart: Regular Abdomen: soft, nontender, nondistended, no obvious ascites, no peritoneal signs, normal bowel sounds. No organomegaly. Extremities: No clubbing cyanosis or edema. Contractures Psychiatric: alert and oriented x3. Cooperative   ASSESSMENT:  #1. GERD with Barrett's esophagus and a history of peptic stricture. Some breakthrough reflux symptoms on pantoprazole. Coughing and choking spells as described, though not at all clear he is having esophageal dysphagia. Preferred to proceed with surveillance endoscopy with possible dilation. They're not interested presently but are aware of the risks (food impaction, cancer)   PLAN:  #1. Reflux precautions. Avoid late meals #2. Continue pantoprazole 40 mg daily. I told them that we can increase this to twice a day if breakthrough symptoms frequent #3. Schedule endoscopy with surveillance biopsies at their nearest convenience. They will call  25 minutes was spent face-to-face with the patient and his family. Greater than 50% the time used for counseling regarding  GERD, Barrett's esophagus, history of stricture with dysphagia, and coughing/choking spells

## 2016-01-22 ENCOUNTER — Ambulatory Visit (INDEPENDENT_AMBULATORY_CARE_PROVIDER_SITE_OTHER): Payer: Medicare Other | Admitting: Internal Medicine

## 2016-01-22 ENCOUNTER — Other Ambulatory Visit (INDEPENDENT_AMBULATORY_CARE_PROVIDER_SITE_OTHER): Payer: Medicare Other

## 2016-01-22 ENCOUNTER — Encounter: Payer: Self-pay | Admitting: Internal Medicine

## 2016-01-22 VITALS — BP 120/70 | HR 74 | Temp 97.9°F | Resp 18

## 2016-01-22 DIAGNOSIS — Z0000 Encounter for general adult medical examination without abnormal findings: Secondary | ICD-10-CM

## 2016-01-22 DIAGNOSIS — G809 Cerebral palsy, unspecified: Secondary | ICD-10-CM | POA: Diagnosis not present

## 2016-01-22 DIAGNOSIS — K219 Gastro-esophageal reflux disease without esophagitis: Secondary | ICD-10-CM

## 2016-01-22 LAB — COMPREHENSIVE METABOLIC PANEL
ALK PHOS: 74 U/L (ref 39–117)
ALT: 12 U/L (ref 0–53)
AST: 12 U/L (ref 0–37)
Albumin: 4 g/dL (ref 3.5–5.2)
BILIRUBIN TOTAL: 0.6 mg/dL (ref 0.2–1.2)
BUN: 14 mg/dL (ref 6–23)
CO2: 25 mEq/L (ref 19–32)
CREATININE: 0.75 mg/dL (ref 0.40–1.50)
Calcium: 9.3 mg/dL (ref 8.4–10.5)
Chloride: 108 mEq/L (ref 96–112)
GFR: 112.13 mL/min (ref >60.00)
GLUCOSE: 100 mg/dL — AB (ref 70–99)
Potassium: 3.9 mEq/L (ref 3.5–5.1)
SODIUM: 141 meq/L (ref 135–145)
TOTAL PROTEIN: 6.2 g/dL (ref 6.0–8.3)

## 2016-01-22 LAB — CBC
HCT: 44.8 % (ref 39.0–52.0)
HEMOGLOBIN: 15.1 g/dL (ref 13.0–17.0)
MCHC: 33.6 g/dL (ref 30.0–36.0)
MCV: 88.8 fl (ref 78.0–100.0)
PLATELETS: 259 10*3/uL (ref 150.0–400.0)
RBC: 5.05 Mil/uL (ref 4.22–5.81)
RDW: 13.5 % (ref 11.5–15.5)
WBC: 8.4 10*3/uL (ref 4.0–10.5)

## 2016-01-22 LAB — LIPID PANEL
Cholesterol: 147 mg/dL (ref 0–200)
HDL: 42.5 mg/dL (ref >39.00)
LDL Cholesterol: 83 mg/dL (ref 0–99)
NONHDL: 104.2
Total CHOL/HDL Ratio: 3
Triglycerides: 105 mg/dL (ref 0.0–149.0)
VLDL: 21 mg/dL (ref 0.0–40.0)

## 2016-01-22 NOTE — Progress Notes (Signed)
Pre visit review using our clinic review tool, if applicable. No additional management support is needed unless otherwise documented below in the visit note. 

## 2016-01-22 NOTE — Assessment & Plan Note (Signed)
Seeing GI and they are having him taking protonix up to BID for the pain. Previously on nexium and did well but due to insurance coverage had to stop in the last 6 months. Has had EGD in the past with some stricture.

## 2016-01-22 NOTE — Patient Instructions (Signed)
We will check the blood work today and call you back with the results.  Come back in about 1 year if you are doing well and call us sooner if you have any problems or questions.

## 2016-01-22 NOTE — Assessment & Plan Note (Signed)
He is intellectually intact but physically he does need help. He is able to use his right side fairly well and can transfer by himself. Does not have a lot of pain and uses otc tylenol when needed.

## 2016-01-22 NOTE — Progress Notes (Signed)
   Subjective:    Patient ID: Juan Dennis, male    DOB: [birth year], 60 y.o.   MRN: [id number]  HPI The patient is a new 60 YO man coming in for his medical conditions. He does have cerebral palsy and is wheelchair bound. He did have surgery on his legs for spasticity as a child. He is not verbal but is able to communicate with sign language and talking computer. 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 He is able to use his right hand better than the left (not functional). He struggles with severe GERD and has been seeing GI for that for some time. They think that he may need esophageal dilation in the near future.   PMH, [facility], social history reviewed and updated.   Review of Systems  Constitutional: Negative for fever, activity change, appetite change, fatigue and unexpected weight change.  HENT: Negative.   Eyes: Negative.   Respiratory: Negative for cough, chest tightness, shortness of breath and wheezing.   Cardiovascular: Negative for chest pain, palpitations and leg swelling.  Gastrointestinal: Positive for abdominal pain. Negative for nausea, vomiting, diarrhea, constipation and abdominal distention.       GERD  Musculoskeletal: Negative.   Skin: Negative.   Neurological: Negative.   Psychiatric/Behavioral: Negative.       Objective:   Physical Exam  Constitutional: He is oriented to person, place, and time. He appears well-developed and well-nourished.  HENT:  Head: Normocephalic and atraumatic.  Eyes: EOM are normal.  Neck: Normal range of motion.  Cardiovascular: Normal rate and regular rhythm.   Pulmonary/Chest: Effort normal. No respiratory distress. He has no wheezes. He has no rales.  Abdominal: Soft. Bowel sounds are normal. He exhibits no distension. There is tenderness. There is no rebound and no guarding.  Mild tenderness  Neurological: He is alert and oriented to person, place, and time. Coordination abnormal.  Uses  sign language to communicate and can follow commands and answer yes and no with nodding.  Skin: Skin is warm and dry.   Filed Vitals:   01/22/16 1500  BP: 120/70  Pulse: 74  Temp: 97.9 F (36.6 C)  TempSrc: Oral  Resp: 18  SpO2: 98%      Assessment & Plan:

## 2016-03-20 ENCOUNTER — Other Ambulatory Visit: Payer: Self-pay | Admitting: Internal Medicine

## 2016-06-16 DIAGNOSIS — G809 Cerebral palsy, unspecified: Secondary | ICD-10-CM | POA: Diagnosis not present

## 2016-06-16 DIAGNOSIS — R0602 Shortness of breath: Secondary | ICD-10-CM | POA: Diagnosis not present

## 2016-08-02 DIAGNOSIS — H179 Unspecified corneal scar and opacity: Secondary | ICD-10-CM | POA: Diagnosis not present

## 2016-08-02 DIAGNOSIS — H2513 Age-related nuclear cataract, bilateral: Secondary | ICD-10-CM | POA: Diagnosis not present

## 2016-09-08 ENCOUNTER — Telehealth: Payer: Self-pay | Admitting: Internal Medicine

## 2016-09-08 NOTE — Telephone Encounter (Signed)
Pt mother called in and would like [facility] One arm drive wheelchair  Fax number [phone] Phone number [phone]

## 2016-09-09 NOTE — Telephone Encounter (Signed)
When was the last time he had a wheelchair? He will need 30 minute visit with me and he may need PT evaluation for insurance to pay for this.

## 2016-09-09 NOTE — Telephone Encounter (Signed)
Patient will need an appointment with you first, correct?

## 2016-09-10 NOTE — Telephone Encounter (Signed)
Patients mother called back to give us information for a prescription for the wheelchair. I let her know that Dr Okey Duprerawford requested a 30 min OV. She got very upset saying he is wheelchair bound and shes on a walker so her daughter would have to miss work to bring them.She then said he was just in not long ago for a new patient appt and to use that as the OV. I told her that was back in 01/2016 and that he would need a new 30 OV. She stated he has had this since birth and has never had to have an appt. I explained it was for medicare purposes and Dr Okey Duprerawford would be able to help with this after he is seen. He said she wasn't going to argue, that she would call the insurance company and find out why a visit is required. I told her we were not arguing we were just letting he know the requirements. He hasnt had a wheelchair replaced by insurance in over 5 years. She will call back to schedule appt at later date. Thanks

## 2016-09-22 ENCOUNTER — Other Ambulatory Visit: Payer: Self-pay | Admitting: Internal Medicine

## 2016-09-23 ENCOUNTER — Ambulatory Visit (INDEPENDENT_AMBULATORY_CARE_PROVIDER_SITE_OTHER): Payer: Medicare Other | Admitting: Internal Medicine

## 2016-09-23 ENCOUNTER — Encounter: Payer: Self-pay | Admitting: Internal Medicine

## 2016-09-23 DIAGNOSIS — R059 Cough, unspecified: Secondary | ICD-10-CM

## 2016-09-23 DIAGNOSIS — R05 Cough: Secondary | ICD-10-CM | POA: Diagnosis not present

## 2016-09-23 MED ORDER — ALBUTEROL SULFATE HFA 108 (90 BASE) MCG/ACT IN AERS: 2.0000 | INHALATION_SPRAY | Freq: Four times a day (QID) | RESPIRATORY_TRACT | 2 refills | Status: DC | PRN

## 2016-09-23 MED ORDER — HYDROCODONE-HOMATROPINE 5-1.5 MG/5ML PO SYRP: 5.0000 mL | ORAL_SOLUTION | Freq: Three times a day (TID) | ORAL | 0 refills | Status: DC | PRN

## 2016-09-23 NOTE — Assessment & Plan Note (Signed)
Rx for hydromet cough syrup and albuterol inhaler. No indication for antibiotics today.

## 2016-09-23 NOTE — Progress Notes (Signed)
Pre visit review using our clinic review tool, if applicable. No additional management support is needed unless otherwise documented below in the visit note. 

## 2016-09-23 NOTE — Patient Instructions (Signed)
We have sent in the albuterol inhaler which you can use for breathing when needed.   We have given you the cough syrup and the prescription for the manual wheelchair today.   If the cough is not better on Monday call us back and we can send in an antibiotic.

## 2016-09-23 NOTE — Progress Notes (Signed)
   Subjective:    Patient ID: Juan Dennis, male    DOB: 11-12-1956, 60 y.o.   MRN: [id number]  HPI The patient is a 60 YO man coming in for cough for 5 days. Previously he has had inhaler for use when sick but is out now. His mom is with him and gives history as he mostly communicates non-verbally with his cerberal palsy. Overall cough is improving. Mild nasal drainage. Mild ear drainage. No fevers or chills. No SOB but disrupting his sleep due to coughing.   Review of Systems  Constitutional: Negative for activity change, appetite change, diaphoresis, fatigue, fever and unexpected weight change.  HENT: Positive for congestion, ear pain and postnasal drip. Negative for ear discharge, rhinorrhea, sinus pressure, sore throat and trouble swallowing.   Eyes: Negative.   Respiratory: Positive for cough. Negative for chest tightness, shortness of breath and wheezing.   Cardiovascular: Negative.   Gastrointestinal: Negative.   Musculoskeletal: Negative.       Objective:   Physical Exam  Constitutional: He appears well-developed and well-nourished.  HENT:  Head: Normocephalic and atraumatic.  TMs bilateral normal, some redness nasal turbinates and oropharynx with clear drainage.   Eyes: EOM are normal.  Neck: Normal range of motion.  Cardiovascular: Normal rate and regular rhythm.   Pulmonary/Chest: Effort normal and breath sounds normal. No respiratory distress. He has no wheezes. He has no rales.  Abdominal: Soft. He exhibits no distension. There is no tenderness.  Musculoskeletal: He exhibits no edema.  Neurological: He is alert.  Skin: Skin is warm and dry.   Vitals:   09/23/16 1506  BP: 140/70  Pulse: (!) 104  Resp: 20  Temp: 98.4 F (36.9 C)  TempSrc: Oral  SpO2: 97%      Assessment & Plan:

## 2016-09-29 DIAGNOSIS — H2513 Age-related nuclear cataract, bilateral: Secondary | ICD-10-CM | POA: Diagnosis not present

## 2016-09-29 DIAGNOSIS — H18603 Keratoconus, unspecified, bilateral: Secondary | ICD-10-CM | POA: Diagnosis not present

## 2016-12-17 ENCOUNTER — Other Ambulatory Visit: Payer: Self-pay | Admitting: Internal Medicine

## 2017-01-06 ENCOUNTER — Telehealth: Payer: Self-pay | Admitting: Internal Medicine

## 2017-01-06 NOTE — Telephone Encounter (Signed)
Pt called request to speak to the assistant concern about order for wheel chair that send fax over to [facility] [place]. She said they need more information from our office in order to complete this order. Please give them call to see what all they need and also give pt a call back.

## 2017-01-12 NOTE — Telephone Encounter (Signed)
Contacted patients mother and she gave me number to [facility] to get everything straight, called [facility] and asked what was missing or wrong and requested that they send everything that we need, gave fax number and doctors name to have everything sent to and called back again patients mother to inform of phone call.

## 2017-01-21 ENCOUNTER — Telehealth: Payer: Self-pay | Admitting: Internal Medicine

## 2017-01-21 NOTE — Telephone Encounter (Signed)
Was going through [facility] to get the wheel chair but they couldn't get the wheelchair that he needed so they went through [facility] [place]. They have the Rx. She just wanted to make you aware in case they called Dr. Okey Duprerawford.

## 2017-01-24 NOTE — Telephone Encounter (Signed)
Fine

## 2017-02-11 NOTE — Telephone Encounter (Signed)
Is anything needed from me?

## 2017-02-11 NOTE — Telephone Encounter (Signed)
Not that I know of, just informing.

## 2017-02-11 NOTE — Telephone Encounter (Signed)
Sydnee Cabalonstance M, a nurse with [facility] [employer] [facility] [employer], called and said that the patient was approved for a high strength wheel chair from February 11, 2017 through December 13, 2017 for rental converting to purchase after that date. Auth# [id number]. She will be sending over a fax with this information as well.  Thanks, [employer]

## 2017-02-16 ENCOUNTER — Other Ambulatory Visit: Payer: Self-pay | Admitting: *Deleted

## 2017-02-16 MED ORDER — ALBUTEROL SULFATE HFA 108 (90 BASE) MCG/ACT IN AERS: 2.0000 | INHALATION_SPRAY | Freq: Four times a day (QID) | RESPIRATORY_TRACT | 0 refills | Status: DC | PRN

## 2017-02-16 NOTE — Addendum Note (Signed)
Addended by: Deatra JamesBRAND, Estrellita Lasky M on: 02/16/2017 03:44 PM   Modules accepted: Orders

## 2017-02-16 NOTE — Telephone Encounter (Signed)
Pt mom left msg on triage stating pt needing refill on his ventolin inhaler. Sent 1 inhaler pt is due for yearly physical.../lmb

## 2017-02-21 NOTE — Telephone Encounter (Signed)
Pt mother called about this and wants to the status of this   Juan MornDavid Dennis at [place]- fax [phone] Phone number [phone]

## 2017-02-21 NOTE — Telephone Encounter (Signed)
contacted mother and she is going to get the info faxed over again

## 2017-03-17 ENCOUNTER — Telehealth: Payer: Self-pay | Admitting: Internal Medicine

## 2017-03-17 NOTE — Telephone Encounter (Signed)
Juan Dennis from [facility] called about an order for a manual wheelchair with a one arm drive that was sent over to them for the pt. She said that the pts mother sent it to them because she can not find anyone that will supply the wheelchair for him. She asked if we could send some face to face notes over to them for clarification on this order. Their fax number is [phone]. Please advise.

## 2017-03-17 NOTE — Telephone Encounter (Signed)
Please advise 

## 2017-03-31 NOTE — Telephone Encounter (Addendum)
Pts mother was here in the office checking on the status of this. She asked for clinical notes so that she could send them to [facility]. I gave her the phone number to contact medical records. ** Pt contacted medical records and we are faxing over a Patient Request for Access from so that they can send the necessary information to [facility].

## 2017-04-20 NOTE — Telephone Encounter (Signed)
Please advise, patients wife was told that she will probably have to bring husband in for visit since we cant just make new notes with the 2018 year on them since we have not seen him this year, do you agree he needs a visit

## 2017-04-20 NOTE — Telephone Encounter (Signed)
Pt wife states she is having trouble getting the power wheel chair ,  the face to face notes are from 2017. They need current notes. Pt wife is upset she does not want to bring husband in. States it is difficult to bring him in.  Pt states since husbands condition is permanent she does not believe he needs to be seen. I told her he may need another appt he has not been seen since 10/17, she got even more upset and said to please ask Dr. Okey Duprerawford to do this.   They need updated face to face notes faxed to wanda, fax number in below notes.

## 2017-04-21 NOTE — Telephone Encounter (Signed)
I cannot date a face to face in 2018 if we have not had a face to face. We can only provide medical records done already and changing the date on a note is considered forgery. If the company is not able to accept he would need another visit for face to face for the electric wheelchair per medicare guidelines. If they have concerns they can take it up with the insurance company as we do not make these rules.

## 2017-04-21 NOTE — Telephone Encounter (Signed)
Spoke with patients wife and she asked if she could get a letter dated present that states he was seen for face to face in 2017, I stated to patietn that she should check with insurance first to make sure they would take a letter like that before going any further, waiting for patients wife to call back

## 2017-05-02 ENCOUNTER — Ambulatory Visit: Payer: Medicare Other | Admitting: Internal Medicine

## 2017-05-11 ENCOUNTER — Ambulatory Visit (INDEPENDENT_AMBULATORY_CARE_PROVIDER_SITE_OTHER): Payer: Medicare Other | Admitting: Internal Medicine

## 2017-05-11 ENCOUNTER — Encounter: Payer: Self-pay | Admitting: Internal Medicine

## 2017-05-11 DIAGNOSIS — H2513 Age-related nuclear cataract, bilateral: Secondary | ICD-10-CM | POA: Diagnosis not present

## 2017-05-11 DIAGNOSIS — H18603 Keratoconus, unspecified, bilateral: Secondary | ICD-10-CM | POA: Diagnosis not present

## 2017-05-11 DIAGNOSIS — G809 Cerebral palsy, unspecified: Secondary | ICD-10-CM | POA: Diagnosis not present

## 2017-05-11 MED ORDER — HYDROCODONE-HOMATROPINE 5-1.5 MG/5ML PO SYRP: 5.0000 mL | ORAL_SOLUTION | Freq: Three times a day (TID) | ORAL | 0 refills | Status: DC | PRN

## 2017-05-11 NOTE — Assessment & Plan Note (Signed)
Right sided drive manual wheelchair is necessary and the best mobility aid for this patient. Face to face done today and needs help now with transfers with some increased leg weakness over the last 1-2 years. Recommend gel cushion to help prevent recurrence of decubitus ulcers.

## 2017-05-11 NOTE — Patient Instructions (Signed)
We will get the face to face note done and faxed in for the wheelchair.

## 2017-05-11 NOTE — Progress Notes (Signed)
   Subjective:    Patient ID: Juan Dennis, male    DOB: [birth year], 61 y.o.   MRN: [id number]  HPI The patient is a 61 YO man coming in for face to face for his manual 1 arm drive wheelchair. He does have cerebral palsy and is unable to ambulate independently with walking, cane, or walker due to his health condition. He is able to use his arm to manipulate the wheelchair and has not had any safety issues with falls.  He has history of sores on his bottom although none currently and it is recommended to have a gel cushion to help prevent sores since he is not able to independently maneuver in and out of the chair.   This condition is deemed permanent and stable and wheelchair length of need is lifetime.   [facility], social history reviewed and updated.   Review of Systems  Constitutional: Positive for activity change. Negative for appetite change, chills, fatigue, fever and unexpected weight change.  HENT: Negative.   Eyes: Negative.   Respiratory: Negative.   Cardiovascular: Negative.   Gastrointestinal: Positive for constipation. Negative for abdominal distention, abdominal pain, diarrhea, nausea and vomiting.  Musculoskeletal: Negative.   Neurological: Positive for dizziness and weakness. Negative for seizures, facial asymmetry, speech difficulty, light-headedness, numbness and headaches.  Hematological: Negative.   Psychiatric/Behavioral: Negative.       Objective:   Physical Exam  Constitutional: He is oriented to person, place, and time. He appears well-developed and well-nourished.  HENT:  Head: Normocephalic and atraumatic.  Right Ear: External ear normal.  Left Ear: External ear normal.  Eyes: EOM are normal.  Neck: Normal range of motion.  Cardiovascular: Normal rate and regular rhythm.   Pulmonary/Chest: Effort normal and breath sounds normal. No respiratory distress. He has no wheezes. He has no rales.  Abdominal: Soft. Bowel sounds are normal. He exhibits no distension.  There is no tenderness. There is no rebound.  Musculoskeletal: He exhibits no edema.  Neurological: He is alert and oriented to person, place, and time. A cranial nerve deficit is present. Coordination abnormal.  Legs are too weak to support weight, uses manual wheelchair, uses electronic device and sign language for communication.   Skin: Skin is warm and dry.  Psychiatric: He has a normal mood and affect.   Vitals:   05/11/17 0918  BP: (!) 154/80  Pulse: 87  Resp: 14  Temp: 97.6 F (36.4 C)  TempSrc: Oral  SpO2: 98%  Height: 5\' 7"  (1.702 m)      Assessment & Plan:  Visit time 25 minutes: greater than 50% of that time was spent in face to face with the patient and his family on counseling and coordination of care: counseling about mobility and ADL ability as well as his stability.

## 2017-05-12 ENCOUNTER — Ambulatory Visit: Payer: Medicare Other | Admitting: Internal Medicine

## 2017-06-02 ENCOUNTER — Telehealth: Payer: Self-pay | Admitting: *Deleted

## 2017-06-02 NOTE — Telephone Encounter (Signed)
Rec'd call from pt mom stating [facility] not received face to face notes from 05/10/17. Need notes faxed to [facility] @ [phone] (P) [phone]. Faxed face-to-face 05/11/17...Raechel Chute/lmb

## 2017-06-26 ENCOUNTER — Other Ambulatory Visit: Payer: Self-pay | Admitting: Internal Medicine

## 2017-09-21 DIAGNOSIS — R0602 Shortness of breath: Secondary | ICD-10-CM | POA: Diagnosis not present

## 2017-09-21 DIAGNOSIS — G809 Cerebral palsy, unspecified: Secondary | ICD-10-CM | POA: Diagnosis not present

## 2017-09-23 ENCOUNTER — Other Ambulatory Visit: Payer: Self-pay | Admitting: Internal Medicine

## 2017-12-30 ENCOUNTER — Other Ambulatory Visit: Payer: Self-pay | Admitting: Internal Medicine

## 2018-01-31 ENCOUNTER — Ambulatory Visit: Payer: Medicare Other | Admitting: Internal Medicine

## 2018-01-31 ENCOUNTER — Encounter: Payer: Self-pay | Admitting: Internal Medicine

## 2018-01-31 ENCOUNTER — Encounter (INDEPENDENT_AMBULATORY_CARE_PROVIDER_SITE_OTHER): Payer: Self-pay

## 2018-01-31 VITALS — BP 110/70 | HR 62 | Ht 67.0 in

## 2018-01-31 DIAGNOSIS — K2100 Gastro-esophageal reflux disease with esophagitis, without bleeding: Secondary | ICD-10-CM

## 2018-01-31 DIAGNOSIS — K222 Esophageal obstruction: Secondary | ICD-10-CM

## 2018-01-31 DIAGNOSIS — K2270 Barrett's esophagus without dysplasia: Secondary | ICD-10-CM | POA: Diagnosis not present

## 2018-01-31 MED ORDER — PANTOPRAZOLE SODIUM 40 MG PO TBEC: 40.0000 mg | DELAYED_RELEASE_TABLET | Freq: Two times a day (BID) | ORAL | 3 refills | Status: DC

## 2018-01-31 NOTE — Patient Instructions (Signed)
We have sent the following medications to your pharmacy for you to pick up at your convenience:   Protonix  

## 2018-01-31 NOTE — Progress Notes (Signed)
HISTORY OF PRESENT ILLNESS:  Juan Dennis is a 62 y.o. male with cerebral palsy who has been followed in this office for chronic GERD complicated by Barrett's esophagus and esophageal stricture requiring dilation. He is accompanied today by his mother. He presents less frequently than request. Last seen February 2017. At that time he was experiencing breakthrough symptoms and possible dysphagia. They were not immediately interested in endoscopy or esophageal dilation. He was advised with regards to reflux precautions. Continued on pantoprazole 40 mg daily. Asked to contact this office to schedule endoscopy with surveillance biopsies. They were to call but did not. The complaints today are the same as they were 2 years ago. He experiences breakthrough reflux as manifested by regurgitation with pyrosis. Often worse when lying at night. Has not been watching timing of evening meal prior to bedtime. Has a hospital bed which does not incline at the top. Nothing that sounds like food impaction, though they cut his food up well. Last upper endoscopy 2014. Barrett's esophagus without dysplasia. No obvious stricture though empiric dilation carried out.  REVIEW OF SYSTEMS:  All non-GI ROS negative except for sinus allergy, cough, visual change, headaches, hearing problems  Past Medical History:  Diagnosis Date  . Asthma    when younger  . Barrett's esophagus   . Cerebral palsy (HCC)   . Complication of anesthesia   . Esophageal stricture   . Esophagitis   . GERD (gastroesophageal reflux disease)   . Hiatal hernia   . Shortness of breath     Past Surgical History:  Procedure Laterality Date  . adductor myotomy     both legs  . ESOPHAGOGASTRODUODENOSCOPY N/A 01/23/2013   Procedure: ESOPHAGOGASTRODUODENOSCOPY (EGD);  Surgeon: Hilarie Fredrickson, MD;  Location: [facility];  Service: Endoscopy;  Laterality: N/A;  . SAVORY DILATION N/A 01/23/2013   Procedure: SAVORY DILATION;  Surgeon: Hilarie Fredrickson, MD;   Location: [facility];  Service: Endoscopy;  Laterality: N/A;    Social History Juan Dennis  reports that  has never smoked. he has never used smokeless tobacco. He reports that he does not drink alcohol or use drugs.  family history includes Colon cancer in his maternal grandmother; Diabetes in his father; Heart disease in his father and mother; Uterine cancer in his maternal grandmother.  No Known Allergies     PHYSICAL EXAMINATION: Vital signs: BP 110/70   Pulse 62   Ht 5\' 7"  (1.702 m)   BMI 20.36 kg/m   Constitutional: Pleasant, wheelchair-bound,, no acute distress Psychiatric: alert and oriented x3, cooperative Eyes: extraocular movements intact, anicteric, conjunctiva pink Mouth: oral pharynx moist, no lesions. No thrush Neck: supple no lymphadenopathy Cardiovascular: heart regular rate and rhythm, no murmur Lungs: clear to auscultation bilaterally Abdomen: soft, nontender, nondistended, no obvious ascites, no peritoneal signs, normal bowel sounds, no organomegaly Rectal: Omitted Extremities: no clubbing, cyanosis, or lower extremity edema bilaterally Skin: no lesions on visible extremities Neuro: Deficits associated with cerebral palsy.   ASSESSMENT:  #1. GERD. Breakthrough symptoms, particularly at night #2. History of nondysplastic Barrett's. #3. History of esophageal stricture   PLAN:  #1. Reflux precautions carefully reviewed. Attention to avoiding large meals, meals within 4 hours of bedtime, and elevation of head of bed. I personally reviewed these with the patient and his mother #2. Increase pantoprazole to 40 mg twice daily. Prescription revised and submitted #3. They are not interested in surveillance endoscopy. They understand the precancerous nature of Barrett's. #4. As such, GI follow-up as needed.  25 minutes spent face-to-face. Greater than 50% a time use for counseling regarding his deteriorated GERD and its management

## 2018-02-02 ENCOUNTER — Telehealth: Payer: Self-pay | Admitting: Internal Medicine

## 2018-02-02 NOTE — Telephone Encounter (Signed)
Copied from CRM [id number]. Topic: Quick Communication - Rx Refill/Question >> Feb 02, 2018  2:22 PM Guinevere FerrariMorris, Nichlos Kunzler E, NT wrote: Medication: HYDROcodone-homatropine (HYCODAN) 5-1.5 MG/5ML syrup   Has the patient contacted their pharmacy? YES    (Agent: If no, request that the patient contact the pharmacy for the refill.)   Preferred Pharmacy (with phone number or street name): [id number] [place] - [id number] [facility] [address] [phone] (Phone) [phone])     Agent: Please be advised that RX refills may take up to 3 business days. We ask that you follow-up with your pharmacy.

## 2018-02-02 NOTE — Telephone Encounter (Signed)
Patient's mother Charlane Ferrettima Westwood called [phone], left VM to return call to discuss symptoms having in regards to the request for Hycodan cough syrup.

## 2018-02-04 ENCOUNTER — Other Ambulatory Visit: Payer: Self-pay | Admitting: Internal Medicine

## 2018-02-06 ENCOUNTER — Other Ambulatory Visit: Payer: Self-pay | Admitting: Internal Medicine

## 2018-02-06 NOTE — Telephone Encounter (Signed)
Pt's mom called back in to follow up on request. She would like a call back at: [phone]

## 2018-02-06 NOTE — Telephone Encounter (Signed)
Last OV: 09/23/16 PCP: Dr. Okey Duprerawford [facility] [place] [address] [phone]

## 2018-02-06 NOTE — Telephone Encounter (Signed)
Copied from CRM [phone]. Topic: Quick Communication - Rx Refill/Question >> Feb 06, 2018 11:21 AM Everardo PacificMoton, Ilaisaane Marts, NT wrote: Medication: Albuterol Inhaler & Hydrocodone-Homatropine 5-1.5 mg   Has the patient contacted their pharmacy? Yes   Patients mother calling because her son needs a refill on the above medications. Mother has been in contact with the pharmacy as well for the medication refills. Was told she needs to contact the doctors office   Preferred Pharmacy (with phone number or street name[facility] [place] [address] [phone]   Agent: Please be advised that RX refills may take up to 3 business days. We ask that you follow-up with your pharmacy.

## 2018-02-06 NOTE — Telephone Encounter (Signed)
Copied from CRM #[id number]. Topic: Quick Communication - Rx Refill/Question >> [id number] 25, 2019 11:21 AM Everardo PacificMoton, Kelly, NT wrote: Medication: Albuterol Inhaler & Hydrocodone-Homatropine 5-1.5 mg   Has the patient contacted their pharmacy? Yes   Patients mother calling because her son needs a refill on the above medications. Mother has been in contact with the pharmacy as well for the medication refills. Was told she needs to contact the doctors office   Preferred Pharmacy (with phone number or street name): [facility] [place] [address] [phone]   Agent: Please be advised that RX refills may take up to 3 business days. We ask that you follow-up with your pharmacy. >> Feb 06, 2018  5:17 PM Raquel SarnaHayes, Teresa G wrote: Pt is needing this filled asap.  Pt's mother is trying to get Rx's filled so pt will not have to go ER.  Please call mother of pt to discuss, to let her know if he needs an appt to fill the Proventil HFA or if it can be called in.

## 2018-02-07 ENCOUNTER — Ambulatory Visit (INDEPENDENT_AMBULATORY_CARE_PROVIDER_SITE_OTHER): Payer: Medicare Other | Admitting: Internal Medicine

## 2018-02-07 ENCOUNTER — Encounter: Payer: Self-pay | Admitting: Internal Medicine

## 2018-02-07 DIAGNOSIS — R05 Cough: Secondary | ICD-10-CM | POA: Diagnosis not present

## 2018-02-07 DIAGNOSIS — R059 Cough, unspecified: Secondary | ICD-10-CM

## 2018-02-07 MED ORDER — HYDROCODONE-HOMATROPINE 5-1.5 MG/5ML PO SYRP: 5.0000 mL | ORAL_SOLUTION | Freq: Three times a day (TID) | ORAL | 0 refills | Status: DC | PRN

## 2018-02-07 MED ORDER — ALBUTEROL SULFATE HFA 108 (90 BASE) MCG/ACT IN AERS: 2.0000 | INHALATION_SPRAY | Freq: Four times a day (QID) | RESPIRATORY_TRACT | 3 refills | Status: DC | PRN

## 2018-02-07 MED ORDER — PREDNISONE 20 MG PO TABS: 40.0000 mg | ORAL_TABLET | Freq: Every day | ORAL | 0 refills | Status: DC

## 2018-02-07 NOTE — Progress Notes (Signed)
   Subjective:    Patient ID: Juan Dennis, male    DOB: [birth year], 62 y.o.   MRN: [id number]  HPI The patient is a 62 YO man coming in with his mother and sister for cough and cold symptoms. Started about 4-5 days ago with coughing and fevers. Since the fevers have stopped. He is still coughing and having congestion and drainage. Overall stable since onset. Denies headaches or sinus pressure. Taking otc mucinex. Patient is non-verbal and uses signs to communicate through his family  Review of Systems  Unable to perform ROS: Patient nonverbal  Constitutional: Positive for activity change, appetite change, chills and fever. Negative for fatigue and unexpected weight change.  HENT: Positive for congestion, postnasal drip, rhinorrhea and sinus pressure. Negative for ear discharge, ear pain, sinus pain, sneezing, sore throat, tinnitus, trouble swallowing and voice change.   Eyes: Negative.   Respiratory: Positive for cough. Negative for chest tightness, shortness of breath and wheezing.   Cardiovascular: Negative.   Gastrointestinal: Negative.       Objective:   Physical Exam  Constitutional: He is oriented to person, place, and time. He appears well-developed and well-nourished.  HENT:  Head: Normocephalic and atraumatic.  Oropharynx with redness and clear drainage, nose with swollen turbinates, TMs normal bilaterally  Eyes: EOM are normal.  Neck: Normal range of motion. No thyromegaly present.  Cardiovascular: Normal rate and regular rhythm.  Pulmonary/Chest: Effort normal and breath sounds normal. No respiratory distress. He has no wheezes. He has no rales.  Abdominal: Soft.  Lymphadenopathy:    He has no cervical adenopathy.  Neurological: He is alert and oriented to person, place, and time.  Skin: Skin is warm and dry.   Vitals:   02/07/18 1335  BP: 118/80  Pulse: 65  Temp: 98.2 F (36.8 C)  TempSrc: Oral  SpO2: 95%  Height: 5\' 7"  (1.702 m)      Assessment & Plan:

## 2018-02-07 NOTE — Telephone Encounter (Signed)
Route to CMA °

## 2018-02-07 NOTE — Patient Instructions (Signed)
We have given you the cough medicine.  We have sent in the albuterol and the prednisone. Take 2 pills of the prednisone daily for 5 days.

## 2018-02-08 ENCOUNTER — Encounter: Payer: Self-pay | Admitting: Internal Medicine

## 2018-02-08 NOTE — Assessment & Plan Note (Signed)
[place] narcotic database reviewed and no inappropriate fills. Rx given for hycodan cough syrup and prednisone. Antibiotics are not indicated today but will be called in 3 days from now if no improvement.

## 2018-02-10 ENCOUNTER — Ambulatory Visit: Payer: Self-pay

## 2018-02-10 ENCOUNTER — Telehealth: Payer: Self-pay | Admitting: Internal Medicine

## 2018-02-10 MED ORDER — AZITHROMYCIN 250 MG PO TABS: ORAL_TABLET | ORAL | 1 refills | Status: DC

## 2018-02-10 NOTE — Telephone Encounter (Signed)
   Reason for Disposition . [1] Caller requesting NON-URGENT health information AND [2] PCP's office is the best resource  Answer Assessment - Initial Assessment Questions 1. REASON FOR CALL or QUESTION: "What is your reason for calling today?" or "How can I best help you?" or "What question do you have that I can help answer?"     Pt was seen 02/07/18 in office. Per Mother pt is still coughing white phlegm and is using Albuterol Q 6 hours and taking prednisone. Mother states pt is better but is concerned of the continued hacking cough. She stated Dr. Okey Duprerawford asked Mother to call to report if he was still coughing that she would put pt on an abx. Mother wanted to see if appropriate before the weekend.  Protocols used: INFORMATION ONLY CALL-A-AH

## 2018-02-10 NOTE — Addendum Note (Signed)
Addended by: Corwin LevinsJOHN, Fay Swider W on: 02/10/2018 05:16 PM   Modules accepted: Orders

## 2018-02-10 NOTE — Telephone Encounter (Signed)
  Mother is asking Dr Okey Duprerawford to order abx for pt. Please see triage note

## 2018-02-10 NOTE — Telephone Encounter (Signed)
Patient informed. 

## 2018-02-10 NOTE — Telephone Encounter (Signed)
Ok for antibx - done erx 

## 2018-02-10 NOTE — Telephone Encounter (Signed)
Copied from CRM [id number]. Topic: Quick Communication - See Telephone Encounter >> Feb 10, 2018  1:50 PM Cipriano BunkerLambe, Annette S wrote: CRM for notification. See Telephone encounter for:   Pt. Is a little better but still has hacky cough.  Asking for antibiotic  Wife is wanting to start this weekend .Marland Kitchen. Asking to have called in today.  She said doctor Okey DupreCrawford told her to call if needed.   [id number] [place] - [address] [facility] [address] [place] [id number] Phone: [phone] Fax: [phone]    02/10/18.

## 2018-06-20 ENCOUNTER — Other Ambulatory Visit: Payer: Self-pay | Admitting: Internal Medicine

## 2018-06-20 DIAGNOSIS — G809 Cerebral palsy, unspecified: Secondary | ICD-10-CM

## 2018-06-29 ENCOUNTER — Telehealth: Payer: Self-pay | Admitting: Internal Medicine

## 2018-06-29 MED ORDER — DOXYCYCLINE HYCLATE 100 MG PO TABS: 100.0000 mg | ORAL_TABLET | Freq: Two times a day (BID) | ORAL | 0 refills | Status: DC

## 2018-06-29 NOTE — Telephone Encounter (Signed)
Copied from CRM [id number]. Topic: Quick Communication - See Telephone Encounter >> Jun 29, 2018  2:39 PM Luanna Coleawoud, Jessica L wrote: CRM for notification. See Telephone encounter for: 06/29/18. Pt is having a low grade fever of 100. Pt has had cough for 2 days. He would like something called in for him

## 2018-06-29 NOTE — Telephone Encounter (Signed)
Rx sent in doxycycline. Take 1 pill twice a day for 1 week.

## 2018-06-29 NOTE — Telephone Encounter (Signed)
Informed medication has ben sent

## 2018-07-04 ENCOUNTER — Telehealth: Payer: Self-pay

## 2018-07-04 DIAGNOSIS — G809 Cerebral palsy, unspecified: Secondary | ICD-10-CM | POA: Diagnosis not present

## 2018-07-04 DIAGNOSIS — R0689 Other abnormalities of breathing: Secondary | ICD-10-CM | POA: Diagnosis not present

## 2018-07-04 NOTE — Telephone Encounter (Signed)
Copied from CRM [id number]. Topic: General - Other >> Jul 04, 2018  1:57 PM Gean BirchwoodWilliams-Neal, Sade R wrote: Pts mom called in and stated the bed was just delivered but it isnt going to work they need a hi-low bed . [facility] [place] [phone]

## 2018-07-05 NOTE — Telephone Encounter (Signed)
Is hi-low bed a brand or type of bed? What does rx need to say exactly? Have they sent back the other bed? (if not then insurance will not pay for 2 beds).

## 2018-07-05 NOTE — Telephone Encounter (Signed)
Called patients mother back and states that the bed does not go low enough to the floor for son to sit on the side with feet on floor and was told that a high low type of bed would be what should be ordered. Mother stated that the bed is still currently at the house, was told that they could not take it until another bed was ordered and when they deliver that one they will take the other one back.

## 2018-07-06 NOTE — Telephone Encounter (Signed)
Patient states the script is to say hi-low bed

## 2018-07-06 NOTE — Telephone Encounter (Signed)
Faxed to [facility]  

## 2018-07-06 NOTE — Telephone Encounter (Signed)
Done

## 2018-07-06 NOTE — Telephone Encounter (Signed)
Patient mother is calling to follow up on getting a hi-low bed. Please advise.

## 2018-07-06 NOTE — Telephone Encounter (Signed)
We need the actual name of the bed they want or what to write on prescription. Unless I'm supposed to write hi-low bed on script.

## 2018-07-12 DIAGNOSIS — G809 Cerebral palsy, unspecified: Secondary | ICD-10-CM | POA: Diagnosis not present

## 2018-08-02 DIAGNOSIS — H18603 Keratoconus, unspecified, bilateral: Secondary | ICD-10-CM | POA: Diagnosis not present

## 2018-08-02 DIAGNOSIS — H2513 Age-related nuclear cataract, bilateral: Secondary | ICD-10-CM | POA: Diagnosis not present

## 2018-08-12 DIAGNOSIS — G809 Cerebral palsy, unspecified: Secondary | ICD-10-CM | POA: Diagnosis not present

## 2018-09-07 ENCOUNTER — Telehealth: Payer: Self-pay | Admitting: Internal Medicine

## 2018-09-07 MED ORDER — DOXYCYCLINE HYCLATE 100 MG PO TABS: 100.0000 mg | ORAL_TABLET | Freq: Two times a day (BID) | ORAL | 0 refills | Status: DC

## 2018-09-07 MED ORDER — HYDROCODONE-HOMATROPINE 5-1.5 MG/5ML PO SYRP: 5.0000 mL | ORAL_SOLUTION | Freq: Three times a day (TID) | ORAL | 0 refills | Status: DC | PRN

## 2018-09-07 NOTE — Telephone Encounter (Signed)
Copied from CRM [id number]. Topic: Quick Communication - Rx Refill/Question >> Sep 07, 2018 11:43 AM Crist Infante wrote: Medication: HYDROcodone-homatropine (HYCODAN) 5-1.5 MG/5ML syrup An abx Mom states pt has cerebral palsy and now has an upper resp issue.  Pt is coughing and spitting up because he tries to clear throat out. Pt running low grade fever. Mom is giving tylenol. Pt also reports body aches.  Pt has not slept all night, so bringing him in for an appt would be pushing him because he is weak.   Dr Okey Dupre is out of the office until tues.  Mom is a Engineer, civil (consulting) and thinks he needs abx, and more cough medicine. Would like a call back to see if someone can do this for him. Mom states Dr Okey Dupre has done this for him in the past. She is aware Dr Okey Dupre out, but feels like he is too weak to come in.

## 2018-09-07 NOTE — Telephone Encounter (Signed)
Will send in the Doxycycline and Hycodan based on Dr. Frutoso Chase previous notes/ management of patient; spoke with patient's mother and she agrees to take him to [facility] if symptoms worsen;

## 2018-09-07 NOTE — Telephone Encounter (Signed)
Is there any way that you can send this in for patient. This is something that Dr. Okey Dupre has done in the past with patients condition and living about 45 minutes away.

## 2018-09-11 DIAGNOSIS — G809 Cerebral palsy, unspecified: Secondary | ICD-10-CM | POA: Diagnosis not present

## 2018-09-19 ENCOUNTER — Ambulatory Visit: Payer: Medicare Other | Admitting: Internal Medicine

## 2018-09-19 ENCOUNTER — Encounter: Payer: Self-pay | Admitting: Internal Medicine

## 2018-09-19 VITALS — BP 130/84 | HR 76 | Temp 97.4°F | Ht 67.0 in

## 2018-09-19 DIAGNOSIS — Z0000 Encounter for general adult medical examination without abnormal findings: Secondary | ICD-10-CM

## 2018-09-19 DIAGNOSIS — R05 Cough: Secondary | ICD-10-CM | POA: Diagnosis not present

## 2018-09-19 DIAGNOSIS — R059 Cough, unspecified: Secondary | ICD-10-CM

## 2018-09-19 DIAGNOSIS — G809 Cerebral palsy, unspecified: Secondary | ICD-10-CM

## 2018-09-19 DIAGNOSIS — K219 Gastro-esophageal reflux disease without esophagitis: Secondary | ICD-10-CM

## 2018-09-19 MED ORDER — PREDNISONE 20 MG PO TABS: 40.0000 mg | ORAL_TABLET | Freq: Every day | ORAL | 3 refills | Status: DC

## 2018-09-19 MED ORDER — HYDROCODONE-HOMATROPINE 5-1.5 MG/5ML PO SYRP: 5.0000 mL | ORAL_SOLUTION | Freq: Three times a day (TID) | ORAL | 0 refills | Status: DC | PRN

## 2018-09-19 NOTE — Progress Notes (Signed)
   Subjective:    Patient ID: Juan Dennis, male    DOB: 1956-05-07, 62 y.o.   MRN: [id number]  HPI The patient is a 62 YO man coming in for physical and some post nasal drip and cough after antibiotics recently.   PMH, [name], social history reviewed and updated.  Review of Systems  Constitutional: Positive for activity change. Negative for appetite change, chills, fatigue, fever and unexpected weight change.  HENT: Positive for postnasal drip and rhinorrhea.   Eyes: Negative.   Respiratory: Positive for cough. Negative for chest tightness and shortness of breath.   Cardiovascular: Negative for chest pain, palpitations and leg swelling.  Gastrointestinal: Negative for abdominal distention, abdominal pain, constipation, diarrhea, nausea and vomiting.  Musculoskeletal: Positive for gait problem.  Skin: Negative.   Neurological: Negative for dizziness, light-headedness and headaches.  Psychiatric/Behavioral: Negative.       Objective:   Physical Exam  Constitutional: He appears well-developed and well-nourished.  HENT:  Head: Normocephalic and atraumatic.  Eyes: EOM are normal.  Neck: Normal range of motion.  Cardiovascular: Normal rate and regular rhythm.  Pulmonary/Chest: Effort normal and breath sounds normal. No respiratory distress. He has no wheezes. He has no rales.  Mild rhonchi in the base right lung, partially clears with coughing.   Abdominal: Soft. Bowel sounds are normal. He exhibits no distension. There is no tenderness. There is no rebound.  Musculoskeletal: He exhibits no edema.  Neurological: He is alert. Coordination abnormal.  In automatic wheelchair and some contractures, stable from prior  Skin: Skin is warm and dry.  Psychiatric: He has a normal mood and affect.   Vitals:   09/19/18 1455  BP: 130/84  Pulse: 76  Temp: (!) 97.4 F (36.3 C)  TempSrc: Oral  SpO2: 97%  Height: 5\' 7"  (1.702 m)      Assessment & Plan:

## 2018-09-19 NOTE — Patient Instructions (Signed)
We have sent in prednisone to take to help.

## 2018-09-22 ENCOUNTER — Encounter: Payer: Self-pay | Admitting: Internal Medicine

## 2018-09-22 DIAGNOSIS — Z0000 Encounter for general adult medical examination without abnormal findings: Secondary | ICD-10-CM | POA: Insufficient documentation

## 2018-09-22 NOTE — Assessment & Plan Note (Signed)
Flu shot declined. Shingrix counseled. Tetanus declined. Colonoscopy declines. Counseled about sun safety and mole surveillance. Counseled about the dangers of distracted driving. Given 10 year screening recommendations.

## 2018-09-22 NOTE — Assessment & Plan Note (Signed)
Rx for prednisone and refill hycodan.

## 2018-09-22 NOTE — Assessment & Plan Note (Signed)
Stable and refill protonix as needed.

## 2018-09-22 NOTE — Assessment & Plan Note (Signed)
Stable with some contractures and speech change. Uses sign effectively and computer to talk for him and can communicate well with family. Stable overall and electric wheelchair allows him to be more independent.

## 2018-10-12 DIAGNOSIS — G809 Cerebral palsy, unspecified: Secondary | ICD-10-CM | POA: Diagnosis not present

## 2018-11-11 DIAGNOSIS — G809 Cerebral palsy, unspecified: Secondary | ICD-10-CM | POA: Diagnosis not present

## 2018-11-23 ENCOUNTER — Other Ambulatory Visit: Payer: Self-pay | Admitting: Internal Medicine

## 2018-11-27 DIAGNOSIS — R0602 Shortness of breath: Secondary | ICD-10-CM | POA: Diagnosis not present

## 2018-11-27 DIAGNOSIS — G809 Cerebral palsy, unspecified: Secondary | ICD-10-CM | POA: Diagnosis not present

## 2018-12-12 DIAGNOSIS — G809 Cerebral palsy, unspecified: Secondary | ICD-10-CM | POA: Diagnosis not present

## 2019-01-11 ENCOUNTER — Telehealth: Payer: Self-pay

## 2019-01-11 NOTE — Telephone Encounter (Signed)
Noted thanks °

## 2019-01-11 NOTE — Telephone Encounter (Signed)
Mother would like for them to come to the home. Patent is scheduled to be seen on Tuesday, February 4th.

## 2019-01-11 NOTE — Telephone Encounter (Signed)
Can you call patient and schedule appointment to get PT to come to the house unless they can take patient to PT

## 2019-01-11 NOTE — Telephone Encounter (Signed)
For new home health referrals (where PT comes to the house) he would need visit within 90 days and he does not have this so would need visit. If they can take him to PT then we can order this without a visit.

## 2019-01-11 NOTE — Telephone Encounter (Signed)
Copied from CRM [id number]. Topic: Referral - Request for Referral >> Jan 10, 2019  1:52 PM Lynne Logan D wrote: Has patient seen PCP for this complaint? Yes *If NO, is insurance requiring patient see PCP for this issue before PCP can refer them? Referral for which specialty: PT Preferred provider/office: [place] Reason for referral: Pt has cerebral palsy and his mother would like to have PT come out a few times a week to help with transferring and to give tips on how to safely transfer pt without hurting the back. Please advise. CB# [phone]

## 2019-01-12 DIAGNOSIS — G809 Cerebral palsy, unspecified: Secondary | ICD-10-CM | POA: Diagnosis not present

## 2019-01-16 ENCOUNTER — Ambulatory Visit: Payer: Medicare Other | Admitting: Internal Medicine

## 2019-02-10 DIAGNOSIS — G809 Cerebral palsy, unspecified: Secondary | ICD-10-CM | POA: Diagnosis not present

## 2019-03-06 ENCOUNTER — Ambulatory Visit: Payer: Medicare Other | Admitting: Internal Medicine

## 2019-03-13 DIAGNOSIS — G809 Cerebral palsy, unspecified: Secondary | ICD-10-CM | POA: Diagnosis not present

## 2019-03-22 ENCOUNTER — Ambulatory Visit: Payer: Medicare Other | Admitting: Internal Medicine

## 2019-03-23 ENCOUNTER — Other Ambulatory Visit: Payer: Self-pay | Admitting: Internal Medicine

## 2019-04-12 DIAGNOSIS — G809 Cerebral palsy, unspecified: Secondary | ICD-10-CM | POA: Diagnosis not present

## 2019-05-08 ENCOUNTER — Telehealth: Payer: Self-pay

## 2019-05-08 NOTE — Telephone Encounter (Signed)
Patients mother wanted to reschedule the office visits for her and her family to august. Rescheduled the visits, but mother is wanting to know if she can get another gel or Vonna Kotyk cushion for patients wheel chair the one patient has is so worn out

## 2019-05-09 NOTE — Telephone Encounter (Signed)
She should be able to call the company that supplied the wheelchair and if needed we can do verbal or written prescription to them for supplies if needed.

## 2019-05-10 NOTE — Telephone Encounter (Signed)
Patient mother informed of MD response and stated understanding

## 2019-05-11 ENCOUNTER — Ambulatory Visit: Payer: Medicare Other | Admitting: Internal Medicine

## 2019-08-10 ENCOUNTER — Ambulatory Visit: Payer: Medicare Other | Admitting: Internal Medicine

## 2019-08-31 ENCOUNTER — Other Ambulatory Visit: Payer: Self-pay

## 2019-08-31 ENCOUNTER — Other Ambulatory Visit (INDEPENDENT_AMBULATORY_CARE_PROVIDER_SITE_OTHER): Payer: Medicare Other

## 2019-08-31 ENCOUNTER — Encounter: Payer: Self-pay | Admitting: Internal Medicine

## 2019-08-31 ENCOUNTER — Ambulatory Visit (INDEPENDENT_AMBULATORY_CARE_PROVIDER_SITE_OTHER): Payer: Medicare Other | Admitting: Internal Medicine

## 2019-08-31 VITALS — BP 150/86 | HR 69 | Temp 97.5°F | Ht 67.0 in

## 2019-08-31 DIAGNOSIS — Z0000 Encounter for general adult medical examination without abnormal findings: Secondary | ICD-10-CM

## 2019-08-31 DIAGNOSIS — Z23 Encounter for immunization: Secondary | ICD-10-CM

## 2019-08-31 DIAGNOSIS — K219 Gastro-esophageal reflux disease without esophagitis: Secondary | ICD-10-CM | POA: Diagnosis not present

## 2019-08-31 DIAGNOSIS — R05 Cough: Secondary | ICD-10-CM

## 2019-08-31 DIAGNOSIS — G809 Cerebral palsy, unspecified: Secondary | ICD-10-CM

## 2019-08-31 DIAGNOSIS — R059 Cough, unspecified: Secondary | ICD-10-CM

## 2019-08-31 LAB — COMPREHENSIVE METABOLIC PANEL
ALT: 9 U/L (ref 0–53)
AST: 11 U/L (ref 0–37)
Albumin: 4.2 g/dL (ref 3.5–5.2)
Alkaline Phosphatase: 70 U/L (ref 39–117)
BUN: 12 mg/dL (ref 6–23)
CO2: 27 mEq/L (ref 19–32)
Calcium: 9.3 mg/dL (ref 8.4–10.5)
Chloride: 105 mEq/L (ref 96–112)
Creatinine, Ser: 0.76 mg/dL (ref 0.40–1.50)
GFR: 102.72 mL/min (ref >60.00)
Glucose, Bld: 96 mg/dL (ref 70–99)
Potassium: 3.6 mEq/L (ref 3.5–5.1)
Sodium: 140 mEq/L (ref 135–145)
Total Bilirubin: 0.9 mg/dL (ref 0.2–1.2)
Total Protein: 6.1 g/dL (ref 6.0–8.3)

## 2019-08-31 LAB — CBC
HCT: 44.1 % (ref 39.0–52.0)
Hemoglobin: 15.4 g/dL (ref 13.0–17.0)
MCHC: 34.9 g/dL (ref 30.0–36.0)
MCV: 89.1 fl (ref 78.0–100.0)
Platelets: 247 10*3/uL (ref 150.0–400.0)
RBC: 4.95 Mil/uL (ref 4.22–5.81)
RDW: 13.2 % (ref 11.5–15.5)
WBC: 7.9 10*3/uL (ref 4.0–10.5)

## 2019-08-31 LAB — LIPID PANEL
Cholesterol: 154 mg/dL (ref 0–200)
HDL: 38.9 mg/dL — ABNORMAL LOW (ref >39.00)
LDL Cholesterol: 94 mg/dL (ref 0–99)
NonHDL: 115.52
Total CHOL/HDL Ratio: 4
Triglycerides: 109 mg/dL (ref 0.0–149.0)
VLDL: 21.8 mg/dL (ref 0.0–40.0)

## 2019-08-31 LAB — HEMOGLOBIN A1C: Hgb A1c MFr Bld: 5.5 % (ref 4.6–6.5)

## 2019-08-31 MED ORDER — HYDROCODONE-HOMATROPINE 5-1.5 MG/5ML PO SYRP: 5.0000 mL | ORAL_SOLUTION | Freq: Three times a day (TID) | ORAL | 0 refills | Status: DC | PRN

## 2019-08-31 NOTE — Assessment & Plan Note (Signed)
Flu shot declines. Pneumonia prevnar 13 given at visit. Shingrix counseled. Tetanus declines. Colonoscopy declines. Counseled about sun safety and mole surveillance. Counseled about the dangers of distracted driving. Given 10 year screening recommendations.

## 2019-08-31 NOTE — Progress Notes (Signed)
Subjective:   Patient ID: Juan Dennis, male    DOB: 1956/10/08, 63 y.o.   MRN: [id number]  HPI Here for medicare wellness, no new complaints. Please see A/P for status and treatment of chronic medical problems.   HPI #2: Here for follow up cerebral palsy (stable at this time, some contracture to left arm, mobility is limited and getting more poor as he ages, sister main caretaker and lives with mom also, uses sign to communicate with family and very upbeat) and GERD (sees GI and they put him on BID protonix but they sometimes do not remember to get both doses in or he wakes late and they don't think they can give him the second dose before bed, has chronic cough, prior stricture) and chronic cough (likely from some aspiration and GERD, uses rare hydrocodone syrup and mostly otc cough medicine, takes GERD medication which helps some also, denies SOB, does have small amount of white sputum intermittent).   Diet: heart healthy Physical activity: sedentary Depression/mood screen: negative Hearing: intact to whispered voice Visual acuity: grossly normal, performs annual eye exam  ADLs: capable Fall risk: medium, has hospital bed and wheelchair Home safety: good Cognitive evaluation: intact to orientation, naming, recall and repetition EOL planning: adv directives discussed    [facility]  PHQ-2 Total Score  0      I have personally reviewed and have noted 1. The patient's medical and social history - reviewed today no changes 2. Their use of alcohol, tobacco or illicit drugs 3. Their current medications and supplements 4. The patient's functional ability including ADL's, fall risks, home safety risks and hearing or visual impairment. 5. Diet and physical activities 6. Evidence for depression or mood disorders 7. Care team reviewed and updated  Patient Care Team: Myrlene Brokerrawford, Darrik Richman A, MD as PCP - General (Internal Medicine) Past  Medical History:  Diagnosis Date  . Asthma    when younger  . Barrett's esophagus   . Cerebral palsy (HCC)   . Complication of anesthesia   . Esophageal stricture   . Esophagitis   . GERD (gastroesophageal reflux disease)   . Hiatal hernia   . Shortness of breath    Past Surgical History:  Procedure Laterality Date  . adductor myotomy     both legs  . ESOPHAGOGASTRODUODENOSCOPY N/A 01/23/2013   Procedure: ESOPHAGOGASTRODUODENOSCOPY (EGD);  Surgeon: Hilarie FredricksonJohn N Perry, MD;  [facility];  Service: Endoscopy;  Laterality: N/A;  . SAVORY DILATION N/A 01/23/2013   Procedure: SAVORY DILATION;  Surgeon: Hilarie FredricksonJohn N Perry, MD;  Location: [facility];  Service: Endoscopy;  Laterality: N/A;   Family History  Problem Relation Age of Onset  . Colon cancer Maternal Grandmother   . Uterine cancer Maternal Grandmother   . Diabetes Father   . Heart disease Father   . Heart disease Mother    Review of Systems  Constitutional: Positive for activity change.  HENT: Negative.   Eyes: Negative.   Respiratory: Positive for cough. Negative for chest tightness and shortness of breath.   Cardiovascular: Negative for chest pain, palpitations and leg swelling.  Gastrointestinal: Negative for abdominal distention, abdominal pain, constipation, diarrhea, nausea and vomiting.       GERD  Musculoskeletal: Negative.   Skin: Negative.   Neurological: Negative.   Psychiatric/Behavioral: Negative.     Objective:  Physical Exam Constitutional:      Appearance: He is well-developed.  HENT:     Head:  Normocephalic and atraumatic.  Neck:     Musculoskeletal: Normal range of motion.  Cardiovascular:     Rate and Rhythm: Normal rate and regular rhythm.  Pulmonary:     Effort: Pulmonary effort is normal. No respiratory distress.     Breath sounds: Normal breath sounds. No wheezing or rales.  Abdominal:     General: Bowel sounds are normal. There is no distension.     Palpations: Abdomen is soft.      Tenderness: There is no abdominal tenderness. There is no rebound.  Skin:    General: Skin is warm and dry.  Neurological:     Mental Status: He is alert and oriented to person, place, and time. Mental status is at baseline.     Coordination: Coordination abnormal.     Comments: Signs to mom to communicate, participates in conversation, wheelchair bound, left arm with contracture     Vitals:   08/31/19 1353  BP: (!) 150/86  Pulse: 69  Temp: (!) 97.5 F (36.4 C)  TempSrc: Oral  SpO2: 97%  Height: 5\' 7"  (1.702 m)    Assessment & Plan:  Prevnar 13 given at visit

## 2019-08-31 NOTE — Patient Instructions (Signed)

## 2019-08-31 NOTE — Assessment & Plan Note (Signed)
Advised that they can do pepcid in the evening if taken too close to protonix dosing but advised to try to get both doses to avoid stricture again.

## 2019-08-31 NOTE — Assessment & Plan Note (Signed)
Likely GERD with some possible aspiration. Rx hydrocodone cough syrup today. Reviewed [place] narcotic database and no inappropriate fills.

## 2019-08-31 NOTE — Assessment & Plan Note (Signed)
Stable, they need home health consult to help teach his main caretaker how to safely lift and some other care techniques.

## 2019-09-20 ENCOUNTER — Telehealth: Payer: Self-pay | Admitting: Internal Medicine

## 2019-09-20 NOTE — Telephone Encounter (Signed)
Given rx to you paper to fax

## 2019-09-20 NOTE — Telephone Encounter (Signed)
Hoyer lift

## 2019-09-20 NOTE — Telephone Encounter (Signed)
[place] Therapist calling from [facility] is calling to request a script to be sent for [facility] Lift can it be faxed to family [place] or [place].  [facility]ax- [phone]   Unknown Fax for [facility].  Height- 5'3" or 5'4"  Weight- is unknown - patient is approx 140lb or 150lb.  No way to measure the patient because non ambulatory. The patient is not able to stand  If need documentation is needed to justify the Hower. It can be faxed over.   [place]- [phone]

## 2019-09-20 NOTE — Telephone Encounter (Signed)
[facility]axed to [facility]

## 2019-10-05 DIAGNOSIS — G809 Cerebral palsy, unspecified: Secondary | ICD-10-CM | POA: Diagnosis not present

## 2019-10-22 DIAGNOSIS — R32 Unspecified urinary incontinence: Secondary | ICD-10-CM | POA: Diagnosis not present

## 2019-10-22 DIAGNOSIS — Z9181 History of falling: Secondary | ICD-10-CM | POA: Diagnosis not present

## 2019-10-22 DIAGNOSIS — G809 Cerebral palsy, unspecified: Secondary | ICD-10-CM | POA: Diagnosis not present

## 2019-10-22 DIAGNOSIS — Z7401 Bed confinement status: Secondary | ICD-10-CM | POA: Diagnosis not present

## 2019-10-22 DIAGNOSIS — J45909 Unspecified asthma, uncomplicated: Secondary | ICD-10-CM | POA: Diagnosis not present

## 2019-10-22 DIAGNOSIS — K2270 Barrett's esophagus without dysplasia: Secondary | ICD-10-CM | POA: Diagnosis not present

## 2019-10-22 DIAGNOSIS — K2100 Gastro-esophageal reflux disease with esophagitis, without bleeding: Secondary | ICD-10-CM | POA: Diagnosis not present

## 2019-10-22 DIAGNOSIS — K449 Diaphragmatic hernia without obstruction or gangrene: Secondary | ICD-10-CM | POA: Diagnosis not present

## 2019-10-22 DIAGNOSIS — Z7982 Long term (current) use of aspirin: Secondary | ICD-10-CM | POA: Diagnosis not present

## 2019-11-05 DIAGNOSIS — G809 Cerebral palsy, unspecified: Secondary | ICD-10-CM | POA: Diagnosis not present

## 2019-11-16 ENCOUNTER — Telehealth: Payer: Self-pay

## 2019-11-16 NOTE — Telephone Encounter (Signed)
We cannot order home health again in 60 days without a new face to face or virtual video visit due to medicare limitations.

## 2019-11-16 NOTE — Telephone Encounter (Signed)
Patient's wife just wanted dr Sharlet Salina to know that patient's PT was very very helpful, he finished today and has much progress with PT assistance---physical therapist said patient could do this again in 79 days---so either patient's wife or PT will call again when we reach 60 day mark----routing to dr Sharlet Salina, Juluis Rainier.Marland KitchenMarland KitchenMarland Kitchen

## 2019-11-19 NOTE — Telephone Encounter (Signed)
Patient's mother was advised that we will need another type of office visit closer to the 60 day mark if she wants PT to continue due to medicare guidelines---she will call back at that time to schedule visit

## 2019-12-05 DIAGNOSIS — G809 Cerebral palsy, unspecified: Secondary | ICD-10-CM | POA: Diagnosis not present

## 2020-01-05 DIAGNOSIS — G809 Cerebral palsy, unspecified: Secondary | ICD-10-CM | POA: Diagnosis not present

## 2020-01-11 ENCOUNTER — Telehealth: Payer: Self-pay | Admitting: Internal Medicine

## 2020-01-11 NOTE — Telephone Encounter (Signed)
Please advise about vaccine

## 2020-01-11 NOTE — Telephone Encounter (Signed)
The vaccine should be fine for him to take. I'm not sure what is needed for the hoyer lift.

## 2020-01-11 NOTE — Telephone Encounter (Signed)
Patient's mother called and said she has questions she needs to speak with the nurse about.   She wants to know if patient should get the covid vaccine since he has asthma, she heard he should not.   She also said they are having problems with medicare playing for the hoyer lift because it wasn't per approved.

## 2020-01-14 NOTE — Telephone Encounter (Signed)
Pt's mother has been informed.

## 2020-02-05 DIAGNOSIS — G809 Cerebral palsy, unspecified: Secondary | ICD-10-CM | POA: Diagnosis not present

## 2020-02-21 ENCOUNTER — Ambulatory Visit: Payer: Medicare Other | Admitting: Internal Medicine

## 2020-02-21 ENCOUNTER — Other Ambulatory Visit: Payer: Self-pay

## 2020-02-21 ENCOUNTER — Encounter: Payer: Self-pay | Admitting: Internal Medicine

## 2020-02-21 VITALS — BP 140/78 | HR 80 | Temp 98.1°F

## 2020-02-21 DIAGNOSIS — K2100 Gastro-esophageal reflux disease with esophagitis, without bleeding: Secondary | ICD-10-CM

## 2020-02-21 DIAGNOSIS — K2270 Barrett's esophagus without dysplasia: Secondary | ICD-10-CM | POA: Diagnosis not present

## 2020-02-21 DIAGNOSIS — K222 Esophageal obstruction: Secondary | ICD-10-CM | POA: Diagnosis not present

## 2020-02-21 MED ORDER — PANTOPRAZOLE SODIUM 40 MG PO TBEC: DELAYED_RELEASE_TABLET | ORAL | 3 refills | Status: DC

## 2020-02-21 NOTE — Progress Notes (Signed)
HISTORY OF PRESENT ILLNESS:  Juan Dennis is a 64 y.o. male with cerebral palsy who has been followed in this office for chronic GERD complicated by Barrett's esophagus and esophageal stricture requiring dilation.  As always, he is accompanied by his mother.  He was last seen in this office February 2019.  At that time due to breakthrough reflux symptoms we increased his pantoprazole to 40 mg twice daily.  We did discuss the role of surveillance endoscopy.  His mother was not interested.  They returned today reporting occasional breakthrough symptoms with regurgitation.  They generally get 1 dose of PPI and, not two.  She denies dysphagia.  She did want to discuss endoscopy but desperately hopes not to have to have the procedure performed on Juan Dennis.  No additional complaints.  She does have personal issues with her own abdominal pain which she wanted to discuss with me.  REVIEW OF SYSTEMS:  All non-GI ROS negative unless otherwise stated in the HPI.  Past Medical History:  Diagnosis Date  . Asthma    when younger  . Barrett's esophagus   . Cerebral palsy (HCC)   . Complication of anesthesia   . Esophageal stricture   . Esophagitis   . GERD (gastroesophageal reflux disease)   . Hiatal hernia   . Shortness of breath     Past Surgical History:  Procedure Laterality Date  . adductor myotomy     both legs  . ESOPHAGOGASTRODUODENOSCOPY N/A 01/23/2013   Procedure: ESOPHAGOGASTRODUODENOSCOPY (EGD);  Surgeon: Hilarie Fredrickson, MD;  Location: [facility];  Service: Endoscopy;  Laterality: N/A;  . SAVORY DILATION N/A 01/23/2013   Procedure: SAVORY DILATION;  Surgeon: Hilarie Fredrickson, MD;  Location: [facility];  Service: Endoscopy;  Laterality: N/A;    Social History Juan Dennis  reports that he has never smoked. He has never used smokeless tobacco. He reports that he does not drink alcohol or use drugs.  family history includes Colon cancer in his maternal grandmother; Diabetes in his father; Heart disease  in his father and mother; Uterine cancer in his maternal grandmother.  No Known Allergies     PHYSICAL EXAMINATION: Vital signs: BP 140/78   Pulse 80   Temp 98.1 F (36.7 C)   Constitutional: Obvious cerebral palsy and wheelchair but generally well-appearing, no acute distress Psychiatric: alert and oriented x3, cooperative Eyes: extraocular movements intact, anicteric, conjunctiva pink Mouth: oral pharynx moist, no lesions.  Black tooth Neck: supple no lymphadenopathy Cardiovascular: heart regular rate and rhythm, no murmur Lungs: clear to auscultation bilaterally Abdomen: soft, nontender, nondistended, no obvious ascites, no peritoneal signs, normal bowel sounds, no organomegaly Rectal: Omitted Extremities: no lower extremity edema bilaterally Skin: no lesions on visible extremities Neuro: Multiple deficits of cerebral palsy  ASSESSMENT:  1.  GERD with Barrett's esophagus.  Last endoscopy 2014.  No dysplasia 2.  History of esophageal stricture.  Previous dilation.  They deny dysphagia 3.  Cerebral palsy   PLAN:  1.  Reflux precautions 2.  Encouraged to take PPI twice daily as he is having some breakthrough symptoms 3.  Discussed the risks and benefits of endoscopy.  His mother wishes to hold off.  She would like a follow-up appointment in 1 year. 4.  Office follow-up 1 year 5.  Refill pantoprazole 40 mg twice daily.  Medication risks reviewed.  The mother had questions.  Answered. A total time of 30 minutes was spent preparing to see the patient, reviewing previous records, obtaining comprehensive history,  performing relevant portions of the physical exam, counseling patient and his mother regarding his chronic GERD complicated by Barrett's and peptic stricture, ordering medication and reviewing medication risks, and documenting clinical information in the health record.

## 2020-02-21 NOTE — Patient Instructions (Signed)
We have sent the following medications to your pharmacy for you to pick up at your convenience:  Pantoprazole  Please follow up in one year  

## 2020-03-04 DIAGNOSIS — G809 Cerebral palsy, unspecified: Secondary | ICD-10-CM | POA: Diagnosis not present

## 2020-04-04 DIAGNOSIS — G809 Cerebral palsy, unspecified: Secondary | ICD-10-CM | POA: Diagnosis not present

## 2020-05-04 DIAGNOSIS — G809 Cerebral palsy, unspecified: Secondary | ICD-10-CM | POA: Diagnosis not present

## 2020-06-04 DIAGNOSIS — G809 Cerebral palsy, unspecified: Secondary | ICD-10-CM | POA: Diagnosis not present

## 2020-07-04 DIAGNOSIS — G809 Cerebral palsy, unspecified: Secondary | ICD-10-CM | POA: Diagnosis not present

## 2020-08-28 ENCOUNTER — Other Ambulatory Visit: Payer: Self-pay | Admitting: Internal Medicine

## 2020-12-13 DIAGNOSIS — J189 Pneumonia, unspecified organism: Secondary | ICD-10-CM

## 2020-12-13 HISTORY — DX: Pneumonia, unspecified organism: J18.9

## 2021-01-30 ENCOUNTER — Telehealth: Payer: Self-pay | Admitting: Internal Medicine

## 2021-01-30 MED ORDER — PANTOPRAZOLE SODIUM 40 MG PO TBEC: 40.0000 mg | DELAYED_RELEASE_TABLET | Freq: Two times a day (BID) | ORAL | 0 refills | Status: DC

## 2021-01-30 NOTE — Telephone Encounter (Signed)
Patients sister calling to request a refill on Pantoprazole said their mother just passed away and the patient is running low.

## 2021-01-30 NOTE — Telephone Encounter (Signed)
Refilled Pantoprazole 

## 2021-02-13 ENCOUNTER — Other Ambulatory Visit: Payer: Self-pay | Admitting: Internal Medicine

## 2021-02-13 MED ORDER — HYDROCODONE-HOMATROPINE 5-1.5 MG/5ML PO SYRP: 5.0000 mL | ORAL_SOLUTION | Freq: Three times a day (TID) | ORAL | 0 refills | Status: AC | PRN

## 2021-09-05 ENCOUNTER — Other Ambulatory Visit: Payer: Self-pay | Admitting: Internal Medicine

## 2021-09-14 DIAGNOSIS — Z79899 Other long term (current) drug therapy: Secondary | ICD-10-CM | POA: Diagnosis not present

## 2021-09-14 DIAGNOSIS — K22719 Barrett's esophagus with dysplasia, unspecified: Secondary | ICD-10-CM | POA: Diagnosis not present

## 2021-09-14 DIAGNOSIS — G809 Cerebral palsy, unspecified: Secondary | ICD-10-CM | POA: Diagnosis not present

## 2021-09-14 DIAGNOSIS — Z7689 Persons encountering health services in other specified circumstances: Secondary | ICD-10-CM | POA: Diagnosis not present

## 2021-09-14 DIAGNOSIS — R300 Dysuria: Secondary | ICD-10-CM | POA: Diagnosis not present

## 2021-09-14 DIAGNOSIS — K219 Gastro-esophageal reflux disease without esophagitis: Secondary | ICD-10-CM | POA: Diagnosis not present

## 2021-09-23 DIAGNOSIS — H6123 Impacted cerumen, bilateral: Secondary | ICD-10-CM | POA: Diagnosis not present

## 2021-09-23 DIAGNOSIS — R509 Fever, unspecified: Secondary | ICD-10-CM | POA: Diagnosis not present

## 2021-09-23 DIAGNOSIS — R051 Acute cough: Secondary | ICD-10-CM | POA: Diagnosis not present

## 2021-09-23 DIAGNOSIS — R059 Cough, unspecified: Secondary | ICD-10-CM | POA: Diagnosis not present

## 2021-09-29 DIAGNOSIS — R319 Hematuria, unspecified: Secondary | ICD-10-CM | POA: Diagnosis not present

## 2021-09-29 DIAGNOSIS — N390 Urinary tract infection, site not specified: Secondary | ICD-10-CM | POA: Diagnosis not present

## 2021-10-26 DIAGNOSIS — J189 Pneumonia, unspecified organism: Secondary | ICD-10-CM | POA: Diagnosis not present

## 2021-11-06 DIAGNOSIS — M791 Myalgia, unspecified site: Secondary | ICD-10-CM | POA: Diagnosis not present

## 2021-11-06 DIAGNOSIS — R051 Acute cough: Secondary | ICD-10-CM | POA: Diagnosis not present

## 2021-11-10 DIAGNOSIS — G809 Cerebral palsy, unspecified: Secondary | ICD-10-CM | POA: Diagnosis not present

## 2021-11-10 DIAGNOSIS — E559 Vitamin D deficiency, unspecified: Secondary | ICD-10-CM | POA: Diagnosis not present

## 2021-11-10 DIAGNOSIS — J4540 Moderate persistent asthma, uncomplicated: Secondary | ICD-10-CM | POA: Diagnosis not present

## 2021-11-10 DIAGNOSIS — R051 Acute cough: Secondary | ICD-10-CM | POA: Diagnosis not present

## 2022-02-18 DIAGNOSIS — R918 Other nonspecific abnormal finding of lung field: Secondary | ICD-10-CM | POA: Diagnosis not present

## 2022-02-18 DIAGNOSIS — J189 Pneumonia, unspecified organism: Secondary | ICD-10-CM | POA: Diagnosis not present

## 2022-02-25 ENCOUNTER — Other Ambulatory Visit: Payer: Self-pay | Admitting: Internal Medicine

## 2022-03-01 DIAGNOSIS — G809 Cerebral palsy, unspecified: Secondary | ICD-10-CM | POA: Diagnosis not present

## 2022-03-01 DIAGNOSIS — K117 Disturbances of salivary secretion: Secondary | ICD-10-CM | POA: Diagnosis not present

## 2022-03-01 DIAGNOSIS — R9389 Abnormal findings on diagnostic imaging of other specified body structures: Secondary | ICD-10-CM | POA: Diagnosis not present

## 2022-03-09 ENCOUNTER — Encounter: Payer: Self-pay | Admitting: Internal Medicine

## 2022-03-09 ENCOUNTER — Ambulatory Visit: Payer: Medicare Other | Admitting: Internal Medicine

## 2022-03-09 VITALS — BP 126/72 | HR 81

## 2022-03-09 DIAGNOSIS — K219 Gastro-esophageal reflux disease without esophagitis: Secondary | ICD-10-CM

## 2022-03-09 DIAGNOSIS — K222 Esophageal obstruction: Secondary | ICD-10-CM

## 2022-03-09 DIAGNOSIS — K2270 Barrett's esophagus without dysplasia: Secondary | ICD-10-CM | POA: Diagnosis not present

## 2022-03-09 MED ORDER — PANTOPRAZOLE SODIUM 40 MG PO TBEC: 40.0000 mg | DELAYED_RELEASE_TABLET | Freq: Every day | ORAL | 3 refills | Status: AC

## 2022-03-09 NOTE — Patient Instructions (Signed)
If you are age 68 or older, your body mass index should be between 23-30. Your There is no height or weight on file to calculate BMI. If this is out of the aforementioned range listed, please consider follow up with your Primary Care Provider. ? ?If you are age 72 or younger, your body mass index should be between 19-25. Your There is no height or weight on file to calculate BMI. If this is out of the aformentioned range listed, please consider follow up with your Primary Care Provider.  ? ?________________________________________________________ ? ?The [facility] providers would like to encourage you to use [facility] to communicate with providers for non-urgent requests or questions.  Due to long hold times on the telephone, sending your provider a message by [facility] may be a faster and more efficient way to get a response.  Please allow 48 business hours for a response.  Please remember that this is for non-urgent requests.  ?_______________________________________________________ ? ?We have sent the following medications to your pharmacy for you to pick up at your convenience:  Omeprazole ? ?Please follow up as needed ? ?

## 2022-03-09 NOTE — Progress Notes (Signed)
HISTORY OF PRESENT ILLNESS: ? ?Juan Dennis is a 66 y.o. male with cerebral palsy who has been followed in this office for chronic GERD complicated by Barrett's esophagitis as well as a history of peptic stricture requiring esophageal dilation.  He is accompanied today by his Sister Eunice Blase.  I was sorry to hear of the passing of his mother 1 year ago.  I last saw the patient March 2021.  Last upper endoscopy 2014.  No dysplasia.  At the time of his last visit he was encouraged to take PPI twice daily due to breakthrough symptoms.  Currently taking it once daily.  Denies breakthrough symptoms as long as he is compliant with medication.  No dysphagia.  Review of blood work from October 2022 shows normal CBC with hemoglobin 15.7.  Was hospitalized with pneumonia.  He is recovered. ? ?REVIEW OF SYSTEMS: ? ?All non-GI ROS negative. ?Past Medical History:  ?Diagnosis Date  ? Asthma   ? when younger  ? Barrett's esophagus   ? Cerebral palsy (HCC)   ? Complication of anesthesia   ? Esophageal stricture   ? Esophagitis   ? GERD (gastroesophageal reflux disease)   ? Hiatal hernia   ? Pneumonia 2022  ? Shortness of breath   ? ? ?Past Surgical History:  ?Procedure Laterality Date  ? adductor myotomy    ? both legs  ? ESOPHAGOGASTRODUODENOSCOPY N/A 01/23/2013  ? Procedure: ESOPHAGOGASTRODUODENOSCOPY (EGD);  Surgeon: Hilarie Fredrickson, MD;  Location: [facility];  Service: Endoscopy;  Laterality: N/A;  ? SAVORY DILATION N/A 01/23/2013  ? Procedure: SAVORY DILATION;  Surgeon: Hilarie Fredrickson, MD;  Location: [facility];  Service: Endoscopy;  Laterality: N/A;  ? ? ?Social History ?Suleman H Floren  reports that he has never smoked. He has never used smokeless tobacco. He reports that he does not drink alcohol and does not use drugs. ? ?family history includes Diabetes in his father; Heart disease in his father and mother; Stomach cancer in his maternal grandmother; Uterine cancer in his maternal grandmother. ? ?No Known Allergies ? ?  ? ?PHYSICAL  EXAMINATION: ?Vital signs: BP 126/72   Pulse 81   SpO2 97%   ?Constitutional: Nonverbal and wheelchair, no acute distress ?Psychiatric: alert and oriented x3, cooperative ?Eyes: extraocular movements intact, anicteric, conjunctiva pink ?Mouth: oral pharynx moist, no lesions.  Poor dentition ?Neck: supple no lymphadenopathy ?Cardiovascular: heart regular rate and rhythm, no murmur ?Lungs: clear to auscultation bilaterally ?Abdomen: soft, nontender, nondistended, no obvious ascites, no peritoneal signs, normal bowel sounds, no organomegaly ?Rectal: Omitted ?Extremities: no clubbing or cyanosis.  No lower extremity edema bilaterally multiple contractures ?Skin: no lesions on visible extremities ?Neuro: Multiple neurologic deficits due to cerebral palsy ? ?ASSESSMENT: ? ?1.  GERD with nondysplastic Barrett's esophagus and peptic stricture.  Last EGD 2014.  The family has been reluctant for repeat procedures with concerns over aspiration.  At this point the patient seems to be asymptomatic on once daily PPI.  No recurrent dysphagia or other issues. ? ? ?PLAN: ? ?1.  Reflux precautions ?2.  Refill pantoprazole 40 mg daily ?3.  Okay to take pantoprazole twice daily if breakthrough symptoms occur ?4.  Routine office follow-up 1 year ? ? ? ? ?  ?

## 2022-06-04 DIAGNOSIS — R030 Elevated blood-pressure reading, without diagnosis of hypertension: Secondary | ICD-10-CM | POA: Diagnosis not present

## 2022-06-04 DIAGNOSIS — R051 Acute cough: Secondary | ICD-10-CM | POA: Diagnosis not present

## 2022-06-07 DIAGNOSIS — R051 Acute cough: Secondary | ICD-10-CM | POA: Diagnosis not present

## 2022-08-25 ENCOUNTER — Telehealth: Payer: Self-pay

## 2022-08-25 NOTE — Telephone Encounter (Signed)
Pts sister calling and states pt is going to have to have some dental work done with sedation and she is concerned that he could aspirate. She wanted to know what type of sedation we use here as he has tolerated the procedures here well. Discussed with her he was given propofol for his previous procedures. She verbalized understanding and will speak with the dentists.

## 2022-08-30 DIAGNOSIS — Z131 Encounter for screening for diabetes mellitus: Secondary | ICD-10-CM | POA: Diagnosis not present

## 2022-08-30 DIAGNOSIS — Z130 Encounter for screening for diseases of the blood and blood-forming organs and certain disorders involving the immune mechanism: Secondary | ICD-10-CM | POA: Diagnosis not present

## 2022-08-30 DIAGNOSIS — Z13228 Encounter for screening for other metabolic disorders: Secondary | ICD-10-CM | POA: Diagnosis not present

## 2022-08-30 DIAGNOSIS — Z13220 Encounter for screening for lipoid disorders: Secondary | ICD-10-CM | POA: Diagnosis not present

## 2022-08-30 DIAGNOSIS — R7989 Other specified abnormal findings of blood chemistry: Secondary | ICD-10-CM | POA: Diagnosis not present

## 2022-08-30 DIAGNOSIS — Z1321 Encounter for screening for nutritional disorder: Secondary | ICD-10-CM | POA: Diagnosis not present

## 2022-08-30 DIAGNOSIS — Z0000 Encounter for general adult medical examination without abnormal findings: Secondary | ICD-10-CM | POA: Diagnosis not present

## 2022-09-06 DIAGNOSIS — G802 Spastic hemiplegic cerebral palsy: Secondary | ICD-10-CM | POA: Diagnosis not present

## 2022-09-06 DIAGNOSIS — Z0000 Encounter for general adult medical examination without abnormal findings: Secondary | ICD-10-CM | POA: Diagnosis not present

## 2022-11-12 DIAGNOSIS — Z9189 Other specified personal risk factors, not elsewhere classified: Secondary | ICD-10-CM | POA: Diagnosis not present

## 2022-12-23 ENCOUNTER — Telehealth: Payer: Self-pay | Admitting: Internal Medicine

## 2022-12-23 NOTE — Telephone Encounter (Signed)
I spoke with the pt sister and she will speak with PCP and discuss if they have any recommendations on oral surgeon.

## 2022-12-23 NOTE — Telephone Encounter (Signed)
Patient's sister is calling wondering if Dr Henrene Pastor has any recommendations on oral surgeons states her brother needs surgery soon and wants to make sure he has the best care. Please advise

## 2023-01-04 DIAGNOSIS — R051 Acute cough: Secondary | ICD-10-CM | POA: Diagnosis not present

## 2023-03-07 DIAGNOSIS — R053 Chronic cough: Secondary | ICD-10-CM | POA: Diagnosis not present

## 2023-03-07 DIAGNOSIS — Z9889 Other specified postprocedural states: Secondary | ICD-10-CM | POA: Diagnosis not present

## 2023-03-07 DIAGNOSIS — K219 Gastro-esophageal reflux disease without esophagitis: Secondary | ICD-10-CM | POA: Diagnosis not present

## 2023-04-18 DIAGNOSIS — R0981 Nasal congestion: Secondary | ICD-10-CM | POA: Diagnosis not present

## 2023-04-18 DIAGNOSIS — Z79899 Other long term (current) drug therapy: Secondary | ICD-10-CM | POA: Diagnosis not present

## 2023-04-18 DIAGNOSIS — J4521 Mild intermittent asthma with (acute) exacerbation: Secondary | ICD-10-CM | POA: Diagnosis not present

## 2023-04-18 DIAGNOSIS — J069 Acute upper respiratory infection, unspecified: Secondary | ICD-10-CM | POA: Diagnosis not present

## 2023-05-19 DIAGNOSIS — J45909 Unspecified asthma, uncomplicated: Secondary | ICD-10-CM | POA: Diagnosis not present

## 2023-06-07 DIAGNOSIS — G802 Spastic hemiplegic cerebral palsy: Secondary | ICD-10-CM | POA: Diagnosis not present

## 2023-07-18 DIAGNOSIS — H18603 Keratoconus, unspecified, bilateral: Secondary | ICD-10-CM | POA: Diagnosis not present

## 2023-07-18 DIAGNOSIS — L249 Irritant contact dermatitis, unspecified cause: Secondary | ICD-10-CM | POA: Diagnosis not present

## 2023-08-12 DIAGNOSIS — R231 Pallor: Secondary | ICD-10-CM | POA: Diagnosis not present

## 2023-08-12 DIAGNOSIS — G809 Cerebral palsy, unspecified: Secondary | ICD-10-CM | POA: Diagnosis not present

## 2023-08-12 DIAGNOSIS — K219 Gastro-esophageal reflux disease without esophagitis: Secondary | ICD-10-CM | POA: Diagnosis not present

## 2023-08-16 DIAGNOSIS — G802 Spastic hemiplegic cerebral palsy: Secondary | ICD-10-CM | POA: Diagnosis not present

## 2023-08-16 DIAGNOSIS — Z7409 Other reduced mobility: Secondary | ICD-10-CM | POA: Diagnosis not present

## 2023-08-16 DIAGNOSIS — G800 Spastic quadriplegic cerebral palsy: Secondary | ICD-10-CM | POA: Diagnosis not present

## 2023-08-16 DIAGNOSIS — Z789 Other specified health status: Secondary | ICD-10-CM | POA: Diagnosis not present

## 2023-08-24 DIAGNOSIS — G800 Spastic quadriplegic cerebral palsy: Secondary | ICD-10-CM | POA: Diagnosis not present

## 2023-08-24 DIAGNOSIS — Z7409 Other reduced mobility: Secondary | ICD-10-CM | POA: Diagnosis not present

## 2023-08-24 DIAGNOSIS — Z789 Other specified health status: Secondary | ICD-10-CM | POA: Diagnosis not present

## 2023-08-24 DIAGNOSIS — G802 Spastic hemiplegic cerebral palsy: Secondary | ICD-10-CM | POA: Diagnosis not present

## 2023-09-06 DIAGNOSIS — G802 Spastic hemiplegic cerebral palsy: Secondary | ICD-10-CM | POA: Diagnosis not present

## 2023-09-06 DIAGNOSIS — G800 Spastic quadriplegic cerebral palsy: Secondary | ICD-10-CM | POA: Diagnosis not present

## 2023-09-06 DIAGNOSIS — Z7409 Other reduced mobility: Secondary | ICD-10-CM | POA: Diagnosis not present

## 2023-09-06 DIAGNOSIS — Z789 Other specified health status: Secondary | ICD-10-CM | POA: Diagnosis not present

## 2023-09-20 DIAGNOSIS — Z789 Other specified health status: Secondary | ICD-10-CM | POA: Diagnosis not present

## 2023-09-20 DIAGNOSIS — G809 Cerebral palsy, unspecified: Secondary | ICD-10-CM | POA: Diagnosis not present

## 2023-09-20 DIAGNOSIS — G802 Spastic hemiplegic cerebral palsy: Secondary | ICD-10-CM | POA: Diagnosis not present

## 2023-09-20 DIAGNOSIS — Z7409 Other reduced mobility: Secondary | ICD-10-CM | POA: Diagnosis not present

## 2023-09-20 DIAGNOSIS — G800 Spastic quadriplegic cerebral palsy: Secondary | ICD-10-CM | POA: Diagnosis not present

## 2023-10-02 DIAGNOSIS — J4521 Mild intermittent asthma with (acute) exacerbation: Secondary | ICD-10-CM | POA: Diagnosis not present

## 2023-10-02 DIAGNOSIS — J069 Acute upper respiratory infection, unspecified: Secondary | ICD-10-CM | POA: Diagnosis not present

## 2023-11-29 DIAGNOSIS — M62442 Contracture of muscle, left hand: Secondary | ICD-10-CM | POA: Diagnosis not present

## 2023-11-29 DIAGNOSIS — M24532 Contracture, left wrist: Secondary | ICD-10-CM | POA: Diagnosis not present

## 2023-11-29 DIAGNOSIS — G801 Spastic diplegic cerebral palsy: Secondary | ICD-10-CM | POA: Diagnosis not present

## 2023-12-01 DIAGNOSIS — G800 Spastic quadriplegic cerebral palsy: Secondary | ICD-10-CM | POA: Diagnosis not present

## 2023-12-01 DIAGNOSIS — Z7409 Other reduced mobility: Secondary | ICD-10-CM | POA: Diagnosis not present

## 2023-12-01 DIAGNOSIS — Z789 Other specified health status: Secondary | ICD-10-CM | POA: Diagnosis not present

## 2023-12-01 DIAGNOSIS — G802 Spastic hemiplegic cerebral palsy: Secondary | ICD-10-CM | POA: Diagnosis not present

## 2023-12-01 DIAGNOSIS — G809 Cerebral palsy, unspecified: Secondary | ICD-10-CM | POA: Diagnosis not present
